# Patient Record
Sex: Male | Born: 1990 | Race: Black or African American | Hispanic: No | Marital: Married | Smoking: Never smoker
Health system: Southern US, Community
[De-identification: ages and names within clinical notes are randomized; demographics above are authoritative.]

## PROBLEM LIST (undated history)

## (undated) DIAGNOSIS — I10 Essential (primary) hypertension: Secondary | ICD-10-CM

## (undated) DIAGNOSIS — K589 Irritable bowel syndrome without diarrhea: Secondary | ICD-10-CM

## (undated) HISTORY — DX: Morbid (severe) obesity due to excess calories: E66.01

## (undated) HISTORY — PX: TONSILLECTOMY: SUR1361

## (undated) HISTORY — PX: KNEE SURGERY: SHX244

## (undated) HISTORY — DX: Irritable bowel syndrome, unspecified: K58.9

---

## 2011-12-09 ENCOUNTER — Encounter (HOSPITAL_COMMUNITY): Payer: Self-pay | Admitting: Emergency Medicine

## 2011-12-09 ENCOUNTER — Emergency Department (INDEPENDENT_AMBULATORY_CARE_PROVIDER_SITE_OTHER): Payer: Self-pay

## 2011-12-09 ENCOUNTER — Emergency Department (INDEPENDENT_AMBULATORY_CARE_PROVIDER_SITE_OTHER)
Admission: EM | Admit: 2011-12-09 | Discharge: 2011-12-09 | Disposition: A | Discharge status: Home / Self Care | Payer: Self-pay | Source: Home / Self Care | Attending: Emergency Medicine | Admitting: Emergency Medicine

## 2011-12-09 DIAGNOSIS — M25469 Effusion, unspecified knee: Secondary | ICD-10-CM

## 2011-12-09 DIAGNOSIS — IMO0002 Reserved for concepts with insufficient information to code with codable children: Secondary | ICD-10-CM

## 2011-12-09 DIAGNOSIS — S8390XA Sprain of unspecified site of unspecified knee, initial encounter: Secondary | ICD-10-CM

## 2011-12-09 HISTORY — DX: Essential (primary) hypertension: I10

## 2011-12-09 MED ORDER — TRAMADOL HCL 50 MG PO TABS: 100.0000 mg | ORAL_TABLET | Freq: Three times a day (TID) | ORAL | Status: AC | PRN

## 2011-12-09 MED ORDER — MELOXICAM 15 MG PO TABS: 15.0000 mg | ORAL_TABLET | Freq: Every day | ORAL | Status: AC
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
## 2012-12-25 ENCOUNTER — Emergency Department (HOSPITAL_COMMUNITY): Payer: BC Managed Care – PPO

## 2012-12-25 ENCOUNTER — Other Ambulatory Visit: Payer: Self-pay

## 2012-12-25 ENCOUNTER — Encounter (HOSPITAL_COMMUNITY): Payer: Self-pay | Admitting: Nurse Practitioner

## 2012-12-25 ENCOUNTER — Emergency Department (HOSPITAL_COMMUNITY)
Admission: EM | Admit: 2012-12-25 | Discharge: 2012-12-25 | Disposition: A | Discharge status: Home / Self Care | Payer: BC Managed Care – PPO | Attending: Emergency Medicine | Admitting: Emergency Medicine

## 2012-12-25 DIAGNOSIS — R0789 Other chest pain: Secondary | ICD-10-CM | POA: Insufficient documentation

## 2012-12-25 DIAGNOSIS — F411 Generalized anxiety disorder: Secondary | ICD-10-CM | POA: Insufficient documentation

## 2012-12-25 DIAGNOSIS — R079 Chest pain, unspecified: Secondary | ICD-10-CM

## 2012-12-25 DIAGNOSIS — I10 Essential (primary) hypertension: Secondary | ICD-10-CM | POA: Insufficient documentation

## 2012-12-25 LAB — PRO B NATRIURETIC PEPTIDE: Pro B Natriuretic peptide (BNP): <5 pg/mL (ref 0–125)

## 2012-12-25 LAB — BASIC METABOLIC PANEL WITH GFR
BUN: 9 mg/dL (ref 6–23)
CO2: 27 meq/L (ref 19–32)
Calcium: 9.8 mg/dL (ref 8.4–10.5)
Chloride: 100 meq/L (ref 96–112)
Creatinine, Ser: 1.01 mg/dL (ref 0.50–1.35)
GFR calc Af Amer: >90 mL/min
GFR calc non Af Amer: >90 mL/min
Glucose, Bld: 143 mg/dL — ABNORMAL HIGH (ref 70–99)
Potassium: 3.9 meq/L (ref 3.5–5.1)
Sodium: 134 meq/L — ABNORMAL LOW (ref 135–145)

## 2012-12-25 LAB — CBC
HCT: 40.2 % (ref 39.0–52.0)
Hemoglobin: 14.6 g/dL (ref 13.0–17.0)
MCH: 27.3 pg (ref 26.0–34.0)
MCHC: 36.3 g/dL — ABNORMAL HIGH (ref 30.0–36.0)
MCV: 75.1 fL — ABNORMAL LOW (ref 78.0–100.0)
Platelets: 176 K/uL (ref 150–400)
RBC: 5.35 MIL/uL (ref 4.22–5.81)
RDW: 14 % (ref 11.5–15.5)
WBC: 11.4 K/uL — ABNORMAL HIGH (ref 4.0–10.5)

## 2012-12-25 LAB — POCT I-STAT TROPONIN I: Troponin i, poc: 0 ng/mL (ref 0.00–0.08)

## 2012-12-25 NOTE — ED Provider Notes (Signed)
History     CSN: [id number]  Arrival date & time 12/25/12  1039   First MD Initiated Contact with Patient 12/25/12 1130      Chief Complaint  Patient presents with  . Chest Pain    (Consider location/radiation/quality/duration/timing/severity/associated sxs/prior treatment) HPI Comments: The patient has noticed symptoms for at least the last several months. He mentioned to the nursing staff that he has had for the past 2 years. The symptoms occur intermittently. He mostly notices it when he is doing light activity. He will also get anxious and nervous. He exercises regularly and states he never has any trouble when he does that. He can go work out at [employer] and will feel completely fine. He denies any leg swelling, no fevers. He denies any history of blood clots or heart disease. He has no significant family history of lung or heart disease.  Patient is a 22 y.o. male presenting with chest pain. The history is provided by the patient.  Chest Pain Pain location:  Substernal area Pain quality: sharp and tightness   Pain radiates to:  Does not radiate Pain severity:  Mild Onset quality:  Sudden Timing:  Sporadic Progression:  Worsening Relieved by:  Nothing Worsened by:  Deep breathing Ineffective treatments:  None tried Associated symptoms: anxiety   Associated symptoms: no fatigue, no fever, no orthopnea, no syncope and not vomiting     Past Medical History  Diagnosis Date  . Hypertension     Past Surgical History  Procedure Laterality Date  . Knee surgery      right    History reviewed. No pertinent family history.  History  Substance Use Topics  . Smoking status: Never Smoker   . Smokeless tobacco: Not on file  . Alcohol Use: No      Review of Systems  Constitutional: Negative for fever and fatigue.  Cardiovascular: Positive for chest pain. Negative for orthopnea and syncope.  Gastrointestinal: Negative for vomiting.  All other systems reviewed and are  negative.    Allergies  Review of patient's allergies indicates no known allergies.  Home Medications  No current outpatient prescriptions on file.  BP 168/101  Pulse 84  Temp(Src) 99 F (37.2 C) (Oral)  Resp 16  SpO2 99%  Physical Exam  Nursing note and vitals reviewed. Constitutional: He appears well-developed and well-nourished. No distress.  Muscular  HENT:  Head: Normocephalic and atraumatic.  Right Ear: External ear normal.  Left Ear: External ear normal.  Eyes: Conjunctivae are normal. Right eye exhibits no discharge. Left eye exhibits no discharge. No scleral icterus.  Neck: Neck supple. No tracheal deviation present.  Cardiovascular: Normal rate, regular rhythm and intact distal pulses.   Pulmonary/Chest: Effort normal and breath sounds normal. No stridor. No respiratory distress. He has no wheezes. He has no rales.  Abdominal: Soft. Bowel sounds are normal. He exhibits no distension. There is no tenderness. There is no rebound and no guarding.  Musculoskeletal: He exhibits no edema and no tenderness.  Neurological: He is alert. He has normal strength. No sensory deficit. Cranial nerve deficit:  no gross defecits noted. He exhibits normal muscle tone. He displays no seizure activity. Coordination normal.  Skin: Skin is warm and dry. No rash noted.  Psychiatric: He has a normal mood and affect.    ED Course  Procedures (including critical care time) EKG Normal sinus rhythm, rate 85 Normal intervals, normal axis Normal ST-T waves No prior EKG for comparison Labs Reviewed  CBC -  Abnormal; Notable for the following:    WBC 11.4 (*)    MCV 75.1 (*)    MCHC 36.3 (*)    All other components within normal limits  BASIC METABOLIC PANEL - Abnormal; Notable for the following:    Sodium 134 (*)    Glucose, Bld 143 (*)    All other components within normal limits  PRO B NATRIURETIC PEPTIDE  POCT I-STAT TROPONIN I   Dg Chest 2 View  12/25/2012  *RADIOLOGY REPORT*   Clinical Data: Chest pain and shortness of breath.  CHEST - 2 VIEW  Comparison: None.  Findings: Two views of the chest demonstrate clear lungs.  Heart and mediastinum are within normal limits and the trachea is midline.  Slightly low lung volumes on the frontal view.  IMPRESSION: No acute chest findings.   Original Report Authenticated By: Richarda Overlie, M.D.       MDM  The patient presented to the emergency room with complaints of recurrent chest pain. I doubt acute coronary syndrome, pulmonary embolism or other acute emergency medical condition. Symptoms could be related to anxiety. I recommend she followup with her primary Dr. to discuss further treatment and evaluation.       Celene Kras, MD 12/25/12 [phone]

## 2012-12-25 NOTE — ED Notes (Addendum)
Pt reports for past 2 years he has had some intermittent CP and noticed recently he has been SOB when walking up stairs. TOday he woke up and felt tightness in chest, increased on inspiration, radiating down left arm, and states it is making him feel anxious, and its worse than normal. A&Ox4, resp e/u

## 2013-05-17 ENCOUNTER — Emergency Department (INDEPENDENT_AMBULATORY_CARE_PROVIDER_SITE_OTHER)
Admission: EM | Admit: 2013-05-17 | Discharge: 2013-05-17 | Disposition: A | Discharge status: Home / Self Care | Payer: BC Managed Care – PPO | Source: Home / Self Care | Attending: Family Medicine | Admitting: Family Medicine

## 2013-05-17 ENCOUNTER — Encounter (HOSPITAL_COMMUNITY): Payer: Self-pay | Admitting: *Deleted

## 2013-05-17 DIAGNOSIS — G44209 Tension-type headache, unspecified, not intractable: Secondary | ICD-10-CM

## 2013-05-17 LAB — POCT I-STAT, CHEM 8
BUN: 13 mg/dL (ref 6–23)
Calcium, Ion: 1.25 mmol/L — ABNORMAL HIGH (ref 1.12–1.23)
Creatinine, Ser: 1 mg/dL (ref 0.50–1.35)
Glucose, Bld: 139 mg/dL — ABNORMAL HIGH (ref 70–99)
Hemoglobin: 15 g/dL (ref 13.0–17.0)
Sodium: 142 mEq/L (ref 135–145)
TCO2: 25 mmol/L (ref 0–100)

## 2013-05-17 MED ORDER — FUROSEMIDE 40 MG PO TABS
ORAL_TABLET | ORAL | Status: AC
  Filled 2013-05-17: qty 1

## 2013-05-17 MED ORDER — TRAZODONE HCL 50 MG PO TABS: 50.0000 mg | ORAL_TABLET | Freq: Every day | ORAL | Status: DC

## 2013-05-17 MED ORDER — FUROSEMIDE 40 MG PO TABS
40.0000 mg | ORAL_TABLET | Freq: Every day | ORAL | Status: DC
  Administered 2013-05-17: 40 mg via ORAL

## 2013-05-17 MED ORDER — FUROSEMIDE 10 MG/ML IJ SOLN
INTRAMUSCULAR | Status: AC
  Filled 2013-05-17: qty 4

## 2013-05-17 NOTE — ED Provider Notes (Signed)
   History    CSN: [id number] Arrival date & time 05/17/13  1001  First MD Initiated Contact with Patient 05/17/13 1018     Chief Complaint  Patient presents with  . Headache   (Consider location/radiation/quality/duration/timing/severity/associated sxs/prior Treatment) Patient is a 22 y.o. male presenting with headaches. The history is provided by the patient.  Headache Pain location:  Frontal Quality:  Dull Radiates to:  Does not radiate Onset quality:  Gradual Duration:  2 weeks Timing:  Intermittent Progression:  Unchanged Chronicity:  New Similar to prior headaches: no   Context: not exposure to bright light   Context comment:  Working 2 jobs, not sleeping or eating well, just get what I can.. Associated symptoms: no dizziness and no numbness    Past Medical History  Diagnosis Date  . Hypertension    Past Surgical History  Procedure Laterality Date  . Knee surgery      right   History reviewed. No pertinent family history. History  Substance Use Topics  . Smoking status: Never Smoker   . Smokeless tobacco: Not on file  . Alcohol Use: No    Review of Systems  Constitutional: Negative.   Cardiovascular: Negative.   Gastrointestinal: Negative.   Neurological: Positive for headaches. Negative for dizziness, weakness and numbness.    Allergies  Review of patient's allergies indicates no known allergies.  Home Medications   Current Outpatient Rx  Name  Route  Sig  Dispense  Refill  . traZODone (DESYREL) 50 MG tablet   Oral   Take 1 tablet (50 mg total) by mouth at bedtime.   15 tablet   1    BP 155/88  Pulse 73  Temp(Src) 98.5 F (36.9 C) (Oral)  Resp 15  SpO2 100% Physical Exam  Nursing note and vitals reviewed. Constitutional: He is oriented to person, place, and time. He appears well-developed and well-nourished. No distress.  HENT:  Head: Normocephalic.  Right Ear: External ear normal.  Left Ear: External ear normal.  Mouth/Throat:  Oropharynx is clear and moist.  Neck: Normal range of motion. Neck supple.  Cardiovascular: Regular rhythm.   Lymphadenopathy:    He has no cervical adenopathy.  Neurological: He is alert and oriented to person, place, and time. No cranial nerve deficit.  Skin: Skin is warm and dry.    ED Course  Procedures (including critical care time) Labs Reviewed  POCT I-STAT, CHEM 8 - Abnormal; Notable for the following:    Glucose, Bld 139 (*)    Calcium, Ion 1.25 (*)    All other components within normal limits   No results found. 1. Tension type headache     MDM    Linna Hoff, MD 05/17/13 1114

## 2013-05-17 NOTE — ED Notes (Signed)
Headache    Blurred  Vision      X  sev  Weeks          Numb  sensation  r  Foot  X   2  Months   Denys  Any  Recent injury  Sitting  On  Exam  Table  In no  Distress     Alert  And  Oriented

## 2014-02-10 ENCOUNTER — Emergency Department (HOSPITAL_COMMUNITY)
Admission: EM | Admit: 2014-02-10 | Discharge: 2014-02-10 | Disposition: A | Discharge status: Home / Self Care | Payer: BC Managed Care – PPO | Attending: Emergency Medicine | Admitting: Emergency Medicine

## 2014-02-10 ENCOUNTER — Encounter (HOSPITAL_COMMUNITY): Payer: Self-pay | Admitting: Emergency Medicine

## 2014-02-10 ENCOUNTER — Emergency Department (HOSPITAL_COMMUNITY): Payer: BC Managed Care – PPO

## 2014-02-10 DIAGNOSIS — R079 Chest pain, unspecified: Secondary | ICD-10-CM

## 2014-02-10 DIAGNOSIS — I10 Essential (primary) hypertension: Secondary | ICD-10-CM | POA: Insufficient documentation

## 2014-02-10 DIAGNOSIS — I517 Cardiomegaly: Secondary | ICD-10-CM | POA: Insufficient documentation

## 2014-02-10 DIAGNOSIS — IMO0001 Reserved for inherently not codable concepts without codable children: Secondary | ICD-10-CM

## 2014-02-10 DIAGNOSIS — R0602 Shortness of breath: Secondary | ICD-10-CM | POA: Insufficient documentation

## 2014-02-10 DIAGNOSIS — E669 Obesity, unspecified: Secondary | ICD-10-CM | POA: Insufficient documentation

## 2014-02-10 DIAGNOSIS — R030 Elevated blood-pressure reading, without diagnosis of hypertension: Secondary | ICD-10-CM

## 2014-02-10 DIAGNOSIS — R0789 Other chest pain: Secondary | ICD-10-CM | POA: Insufficient documentation

## 2014-02-10 LAB — I-STAT TROPONIN, ED: Troponin i, poc: 0.01 ng/mL (ref 0.00–0.08)

## 2014-02-10 LAB — BASIC METABOLIC PANEL
BUN: 9 mg/dL (ref 6–23)
CHLORIDE: 102 meq/L (ref 96–112)
CO2: 27 mEq/L (ref 19–32)
Calcium: 9.1 mg/dL (ref 8.4–10.5)
Creatinine, Ser: 1.15 mg/dL (ref 0.50–1.35)
GFR calc non Af Amer: 88 mL/min — ABNORMAL LOW (ref >90)
GLUCOSE: 92 mg/dL (ref 70–99)
POTASSIUM: 3.9 meq/L (ref 3.7–5.3)
SODIUM: 140 meq/L (ref 137–147)

## 2014-02-10 LAB — CBC
HEMATOCRIT: 38.6 % — AB (ref 39.0–52.0)
HEMOGLOBIN: 13.6 g/dL (ref 13.0–17.0)
MCH: 27.4 pg (ref 26.0–34.0)
MCHC: 35.2 g/dL (ref 30.0–36.0)
MCV: 77.8 fL — ABNORMAL LOW (ref 78.0–100.0)
Platelets: 189 10*3/uL (ref 150–400)
RBC: 4.96 MIL/uL (ref 4.22–5.81)
RDW: 13.2 % (ref 11.5–15.5)
WBC: 5.1 10*3/uL (ref 4.0–10.5)

## 2014-02-10 MED ORDER — NAPROXEN 500 MG PO TABS: 500.0000 mg | ORAL_TABLET | Freq: Two times a day (BID) | ORAL | Status: DC

## 2014-02-10 MED ORDER — TRAMADOL HCL 50 MG PO TABS
50.0000 mg | ORAL_TABLET | Freq: Four times a day (QID) | ORAL | Status: DC | PRN
Start: 2014-02-10 — End: 2014-05-17

## 2014-02-10 MED ORDER — GI COCKTAIL ~~LOC~~
30.0000 mL | Freq: Once | ORAL | Status: AC
  Administered 2014-02-10: 30 mL via ORAL
  Filled 2014-02-10: qty 30

## 2014-02-10 NOTE — ED Provider Notes (Signed)
CSN: [id number]     Arrival date & time 02/10/14  7322 History   First MD Initiated Contact with Patient 02/10/14 0919     Chief Complaint  Patient presents with  . Chest Pain     (Consider location/radiation/quality/duration/timing/severity/associated sxs/prior Treatment) HPI Comments: Patient with history of hypertension, obesity -- presents with complaint of left chest pain with radiation into his left shoulder and arm. Patient has had pain like this in the past, intermittently. Current episode started at 4 PM yesterday while at work. Pain is not worse with movement or palpation. Patient states that when he exerts himself his heart beats fast and he feel short of breath. This is not new. Patient denies risk factors for pulmonary embolism including: unilateral leg swelling, history of DVT/PE/other blood clots, recent immobilizations, recent surgery, recent travel (>4hr segment), malignancy, hemoptysis. No fevers or cough. Patient does have history of heartburn especially at night. Patient denies history of high cholesterol, diabetes or smoking. He does not have a family history of MI or heart disease in first-degree relatives. Patient does not currently have a primary care physician. He states that he was seen in emergency department last July for the same pain. The onset of this condition was acute. The course is constant. Aggravating factors: none. Alleviating factors: none.      Patient is a 23 y.o. male presenting with chest pain. The history is provided by the patient.  Chest Pain Associated symptoms: shortness of breath   Associated symptoms: no abdominal pain, no back pain, no cough, no diaphoresis, no fever, no nausea, no palpitations and not vomiting     Past Medical History  Diagnosis Date  . Hypertension    Past Surgical History  Procedure Laterality Date  . Knee surgery      right   No family history on file. History  Substance Use Topics  . Smoking status: Never Smoker    . Smokeless tobacco: Not on file  . Alcohol Use: No    Review of Systems  Constitutional: Negative for fever and diaphoresis.  Eyes: Negative for redness.  Respiratory: Positive for shortness of breath. Negative for cough.   Cardiovascular: Positive for chest pain. Negative for palpitations and leg swelling.  Gastrointestinal: Negative for nausea, vomiting and abdominal pain.  Genitourinary: Negative for dysuria.  Musculoskeletal: Positive for myalgias. Negative for back pain and neck pain.  Skin: Negative for rash.  Neurological: Negative for syncope and light-headedness.      Allergies  Review of patient's allergies indicates no known allergies.  Home Medications  No current outpatient prescriptions on file. BP 149/88  Pulse 75  Temp(Src) 98 F (36.7 C) (Oral)  Resp 22  Ht 6\' 1"  (1.854 m)  Wt 333 lb (151.048 kg)  BMI 43.94 kg/m2  SpO2 100%  Physical Exam  Nursing note and vitals reviewed. Constitutional: He appears well-developed and well-nourished.  HENT:  Head: Normocephalic and atraumatic.  Mouth/Throat: Mucous membranes are normal. Mucous membranes are not dry.  Eyes: Conjunctivae are normal.  Neck: Trachea normal and normal range of motion. Neck supple. Normal carotid pulses and no JVD present. No muscular tenderness present. Carotid bruit is not present. No tracheal deviation present.  Cardiovascular: Normal rate, regular rhythm, S1 normal, S2 normal, normal heart sounds and intact distal pulses.  Exam reveals no distant heart sounds and no decreased pulses.   No murmur heard. Pulses:      Radial pulses are 2+ on the right side, and 2+ on the left  side.  Pulmonary/Chest: Effort normal and breath sounds normal. No respiratory distress. He has no wheezes. He exhibits no tenderness.  Movement of arm and palpation of chest does not reproduce pain.   Abdominal: Soft. Normal aorta and bowel sounds are normal. There is no tenderness. There is no rebound and no  guarding.  Musculoskeletal: Normal range of motion. He exhibits no edema.  Normal active ROM upper extremities.   Neurological: He is alert.  Skin: Skin is warm and dry. He is not diaphoretic. No cyanosis. No pallor.  Psychiatric: He has a normal mood and affect.    ED Course  Procedures (including critical care time) Labs Review Labs Reviewed  CBC - Abnormal; Notable for the following:    HCT 38.6 (*)    MCV 77.8 (*)    All other components within normal limits  BASIC METABOLIC PANEL - Abnormal; Notable for the following:    GFR calc non Af Amer 88 (*)    All other components within normal limits  I-STAT TROPOININ, ED   Imaging Review Dg Chest 2 View  02/10/2014   CLINICAL DATA:  Two day history of left-sided chest pain radiating into the left arm. Current history of hypertension.  EXAM: CHEST  2 VIEW  COMPARISON:  DG CHEST 2 VIEW dated 12/25/2012  FINDINGS: Cardiac silhouette mildly to moderately enlarged for age, unchanged. Hilar and mediastinal contours otherwise unremarkable. Lungs clear. Bronchovascular markings normal. Pulmonary vascularity normal. No visible pleural effusions. No pneumothorax. Visualized bony thorax intact.  IMPRESSION: Mild to moderate cardiomegaly.  No acute cardiopulmonary disease.   Electronically Signed   By: Evangeline Dakin M.D.   On: 02/10/2014 10:01     EKG Interpretation   Date/Time:  Friday February 10 2014 08:56:25 EDT Ventricular Rate:  74 PR Interval:  194 QRS Duration: 102 QT Interval:  364 QTC Calculation: 404 R Axis:   44 Text Interpretation:  Normal sinus rhythm Normal ECG No significant change  since last tracing Confirmed by Maryan Rued  MD, Loree Fee ([id number]) on 02/10/2014  9:14:37 AM      9:31 AM Patient seen and examined. Work-up initiated. Medications ordered. EKG reviewed.   Vital signs reviewed and are as follows: Filed Vitals:   02/10/14 0856  BP: 149/88  Pulse: 75  Temp: 98 F (36.7 C)  Resp: 22   11:16 AM Patient notified  of results. Symptoms unchanged with GI cocktail.  Patient informed of mild cardiomegaly on chest x-ray. Strongly encouraged PCP followup. Referrals given.  Patient was counseled to return with severe chest pain, especially if the pain is crushing or pressure-like and spreads to the arms, back, neck, or jaw, or if they have sweating, nausea, or shortness of breath with the pain. They were encouraged to call 911 with these symptoms.   They were also told to return if their chest pain gets worse and does not go away with rest, they have an attack of chest pain lasting longer than usual despite rest and treatment with the medications their caregiver has prescribed, if they wake from sleep with chest pain or shortness of breath, if they feel dizzy or faint, if they have chest pain not typical of their usual pain, or if they have any other emergent concerns regarding their health.  The patient verbalized understanding and agreed.     MDM   Final diagnoses:  Chest pain  Cardiomegaly  Elevated blood pressure   Patient with chest tightness. Feel patient is low risk for ACS given history (  poor story for ACS/MI -- intermittent symptoms for a long period of time, constant chest pain for the past 18 hrs), negative troponin(s), normal/unchanged EKG. Also young age. Risk factors: obesity, HTN. No history of high cholesterol, diabetes, family history, non-smoker, no cocaine.  Do not suspect PE in this patient. He is PERC negative.  Cardiomegaly, to be followed by PCP. Consider blood pressure control.  Mildly elevated blood pressure today. Will defer initiation of oral antihypertensives to PCP.  No dangerous or life-threatening conditions suspected or identified by history, physical exam, and by work-up. No indications for hospitalization identified.       Carlisle Cater, PA-C 02/10/14 1121

## 2014-02-10 NOTE — ED Notes (Addendum)
Pt c/o left sided chest pain ongoing for months. Pain increased yesterday while at work that radiated to left arm and pt reports feeling palpitations. Pain accompanied by shortness of breath, nausea, and diaphoresis.

## 2014-02-10 NOTE — ED Notes (Signed)
Cp since July of last year was told then he was exhausted from work works 2 jobs some sob no n/v  Stinging pain and then pain in left shoulder comes and goes and since yesterday constant

## 2014-02-10 NOTE — ED Provider Notes (Signed)
Medical screening examination/treatment/procedure(s) were performed by non-physician practitioner and as supervising physician I was immediately available for consultation/collaboration.   EKG Interpretation   Date/Time:  Friday February 10 2014 08:56:25 EDT Ventricular Rate:  74 PR Interval:  194 QRS Duration: 102 QT Interval:  364 QTC Calculation: 404 R Axis:   44 Text Interpretation:  Normal sinus rhythm Normal ECG No significant change  since last tracing Confirmed by Maryan Rued  MD, Loree Fee ([id number]) on 02/10/2014  9:14:37 AM        Blanchie Dessert, MD 02/10/14 1319

## 2014-02-10 NOTE — Discharge Instructions (Signed)
Please read and follow all provided instructions.  Your diagnoses today include:  1. Chest pain   2. Cardiomegaly   3. Elevated blood pressure     Tests performed today include:  An EKG of your heart - normal  A chest x-ray - shows slightly enlarged heart  Cardiac enzymes - a blood test for heart muscle damage that is normal  Blood counts and electrolytes - normal  Vital signs. See below for your results today.   Medications prescribed:   Naproxen - anti-inflammatory pain medication  Do not exceed 500mg  naproxen every 12 hours, take with food  You have been prescribed an anti-inflammatory medication or NSAID. Take with food. Take smallest effective dose for the shortest duration needed for your pain. Stop taking if you experience stomach pain or vomiting.    Tramadol - narcotic-like pain medication  DO NOT drive or perform any activities that require you to be awake and alert because this medicine can make you drowsy.   Take any prescribed medications only as directed.  Follow-up instructions: Please follow-up with your primary care provider as soon as you can for further evaluation of your symptoms. If you do not have a primary care doctor -- see below for referral information.   Return instructions:  SEEK IMMEDIATE MEDICAL ATTENTION IF:  You have severe chest pain, especially if the pain is crushing or pressure-like and spreads to the arms, back, neck, or jaw, or if you have sweating, nausea (feeling sick to your stomach), or shortness of breath. THIS IS AN EMERGENCY. Don't wait to see if the pain will go away. Get medical help at once. Call 911 or 0 (operator). DO NOT drive yourself to the hospital.   Your chest pain gets worse and does not go away with rest.   You have an attack of chest pain lasting longer than usual, despite rest and treatment with the medications your caregiver has prescribed.   You wake from sleep with chest pain or shortness of breath.  You  feel dizzy or faint.  You have chest pain not typical of your usual pain for which you originally saw your caregiver.   You have any other emergent concerns regarding your health.  Additional Information: Chest pain comes from many different causes. Your caregiver has diagnosed you as having chest pain that is not specific for one problem, but does not require admission.  You are at low risk for an acute heart condition or other serious illness.   Your vital signs today were: BP 149/88   Pulse 75   Temp(Src) 98 F (36.7 C) (Oral)   Resp 19   Ht 6\' 1"  (1.854 m)   Wt 333 lb (151.048 kg)   BMI 43.94 kg/m2   SpO2 100% If your blood pressure (BP) was elevated above 135/85 this visit, please have this repeated by your doctor within one month. --------------  Emergency Department Resource Guide 1) Find a Doctor and Pay Out of Pocket Although you won't have to find out who is covered by your insurance plan, it is a good idea to ask around and get recommendations. You will then need to call the office and see if the doctor you have chosen will accept you as a new patient and what types of options they offer for patients who are self-pay. Some doctors offer discounts or will set up payment plans for their patients who do not have insurance, but you will need to ask so you aren't surprised when you  get to your appointment.  2) Contact Your Local Health Department Not all health departments have doctors that can see patients for sick visits, but many do, so it is worth a call to see if yours does. If you don't know where your local health department is, you can check in your phone book. The CDC also has a tool to help you locate your state's health department, and many state websites also have listings of all of their local health departments.  3) Find a [facility] If your illness is not likely to be very severe or complicated, you may want to try a walk in clinic. These are popping up all over the  country in pharmacies, drugstores, and shopping centers. They're usually staffed by nurse practitioners or physician assistants that have been trained to treat common illnesses and complaints. They're usually fairly quick and inexpensive. However, if you have serious medical issues or chronic medical problems, these are probably not your best option.  No Primary Care Doctor: - Call Health Connect at  [phone] - they can help you locate a primary care doctor that  accepts your insurance, provides certain services, etc. - Physician Referral Service- [phone]  Chronic Pain Problems: Organization         Address  Phone   Notes  [facility]  [phone] Patients need to be referred by their primary care doctor.   Medication Assistance: Organization         Address  Phone   Notes  [facility] Medication [facility] [address] [place], [place] [address] [phone] --Must be a resident of [facility] -- Must have NO insurance coverage whatsoever (no Medicaid/ Medicare, etc.) -- The pt. MUST have a primary care doctor that directs their care regularly and follows them in the community   MedAssist  [phone]   [employer]  [phone]    Agencies that provide inexpensive medical care: Organization         Address  Phone   Notes  [place]  [phone]   [facility]    [phone]   [facility] [place], [place] [address] [phone]   [place] [address] [place] [phone]   Planned Parenthood    [phone]   [facility]    [phone]   [place] and [place] [address], [place] Phone:  [phone], Fax:  [phone] Hours of Operation:  9 am - 6 pm, M-F.  Also accepts Medicaid/Medicare and self-pay.  [facility] for [place], [address], [place] Phone: [phone], Fax: [phone]. Hours of Operation:  8:30 am - 5:30 pm, M-F.  Also accepts Medicaid and self-pay.  [facility] [place] [facility] [address], [place] Phone: [phone]   [place], [place], [place] [phone], Ext. [phone] Mondays & Thursdays: 7-9 AM.  First 15 patients are seen on a first come, first serve basis.    [place] Providers:  Organization         Address  Phone   Notes  [facility] [address], [place] [phone] Also accepts self-pay patients.  [place], [place]  [phone]   [facility]  [address], [place] [phone]   [place] [address] [place] [phone]   Lucianne Lei [address], [place]   [phone] Only accepts [place] Access [place] patients after they have their name applied to their card.   Self-Pay (no insurance) in [facility]:  Organization         Address  Phone   Notes  Sickle Cell Patients, [facility] [place] [phone]   [facility] [place] [phone]   [facility] [place]  [place], [place], [place] [phone]   [facility]/Dr. Osei-Bonsu  [address], [place] or [address], [place] [phone] Phone number for both [place] and [place] locations is the same.  [facility] [address], [place] [phone]   [facility] [address], [place] or [address] [phone]   [facility] [address] [place] [phone], phone; [phone], fax Sees patients 1st and 3rd Saturday of every month.  Must  not qualify for public or private insurance (i.e. Medicaid, Medicare, [place] Health Choice, Veterans' Benefits)  Household income should be no more than 200% of the poverty level The clinic cannot treat you if you are pregnant or think you are pregnant  Sexually transmitted diseases are not treated at the clinic.    Dental Care: Organization         Address  Phone  Notes  [facility] Department of [facility] [phone] Accepts children up to age 64 who are enrolled in [place] or [place]; pregnant women with a Medicaid card; and children who have applied for Medicaid or [place] Health Choice, but were declined, whose parents can pay a reduced fee at time of service.  [facility] Department of [facility]  [address], [place] [phone] Accepts children up to age 45 who are enrolled in [place] or [place]; pregnant women with a Medicaid card; and children who have applied for Medicaid or [place] Health Choice, but were declined, whose parents can pay a reduced fee at time of service.  [place] Adult Dental Access PROGRAM  [place] [phone] Patients are seen by appointment only. Walk-ins are not accepted. [place] will see patients 106 years of age and older. Monday - Tuesday (8am-5pm) Most Wednesdays (8:30-5pm) $30 per visit, cash only  [facility] Adult Dental Access PROGRAM  [address], [facility] [phone] Patients are seen by appointment only. Walk-ins are not accepted. [place] will see patients 66 years of age and older. One Wednesday Evening (Monthly: Volunteer Based).  $30 per visit, cash only  [place]  [phone] for adults; Children under age 6, call Graduate Pediatric Dentistry at [phone]. Children aged 79-14, please call [phone] to request a pediatric application.  Dental services are  provided in all areas of dental care including fillings, crowns and bridges, complete and partial dentures, implants, gum treatment, root canals, and extractions. Preventive care is also provided. Treatment is provided to both adults and children. Patients are selected via a lottery and there is often a waiting list.   [facility] [address] [place]  [phone] [email or web address]   [facility] [address] [place], [place] [phone], Ext. [phone] Second and Fourth Thursday of each month, opens at 6:30 AM; Clinic ends at 9 AM.  Patients are seen on a first-come first-served basis, and a limited number are seen during each clinic.   [facility]  [address] Hillard Danker [place], [place] [phone]   Eligibility Requirements You must have lived in [place], [place], or [place] counties for at least the last three months.   You cannot be eligible for state or federal sponsored [employer], including [employer], [place], or [employer].   You generally cannot be eligible for healthcare insurance through your employer.    How to apply: Eligibility screenings are held every Tuesday and Wednesday afternoon from 1:00 pm until 4:00 pm. You do not need an appointment for the interview!  [facility] [address] [place], [place]   Eugenio Saenz  [place] Department  [place]  [phone]    Behavioral Health Resources in the Community: Intensive Outpatient Programs Organization         Address  Phone  Notes  [place]. [address], [place], [place] [phone]   [facility] [address], [place], [place]   ADS: Alcohol & Drug Svcs [address], [place], [place]   [place] [address]  [place], [place] or [phone]   Substance Abuse Resources Organization         Address  Phone  Notes  Alcohol and Drug Services  [phone]   [place]  [phone]   [facility] [place]   Chinita Pester  [phone]   Residential & Outpatient Substance Abuse Program  [phone]   Psychological Services Organization         Address  Phone  Notes  [facility]  [place]  [phone]   [place] [address], [place] or [phone]    Mobile Crisis Teams Organization         Address  Phone  Notes  [facility], Mobile Crisis Care Unit  [phone]   [facility]  [address] [place], [place]   [place] Levels [address] [place] [phone]    Self-Help/Support Groups Organization         Address  Phone             Notes  [place]. of [place] - variety of support groups  [place] Call for more information  Narcotics Anonymous (NA), [facility] [address], [employer] [place]  2 meetings at this location   Special educational needs teacher         Address  Phone  Notes  [facility]idential Treatment [place],    [place]  [phone]   [facility]  [address], [place] [id number], [place], [place]   [place], [place] [phone] Admissions: 8am-3pm M-F  Incentives Substance [place] [address],    [place], [place] [phone]   [facility] [address] Jadene Pierini [place], [place]   The [place].,  [place],  [place] - Intensive Outpatient [address], [place], [place]   [facility] ([place].,  [place], [place] [phone] or  [phone]   [facility] Services (RTS) [address], [place], [place] Accepts Medicaid  [facility] [address],  [place] [phone] Substance Abuse/Addiction Treatment   [facility] Organization         Address  Phone  Notes  [facility]  [phone]   Domenic Schwab, PhD [address] Arlis Porta [place], [place]   [phone] or [phone]   [place], [place] [phone]   [place] [address], [place], [place] [phone] Insurance/Medicaid/sponsorship through [facility] and Families [address]                                    [place], [place] [phone] [place] [address], [place] [phone]    Dr. Adele Schilder  [phone]   [facility] [place] Dept. 1) [address], [place] 2) [place] 3)  [address], [place] [phone]  [phone]   [place] [phone] or [phone] (After Hours)

## 2014-05-17 ENCOUNTER — Encounter (HOSPITAL_COMMUNITY): Payer: Self-pay | Admitting: Emergency Medicine

## 2014-05-17 ENCOUNTER — Emergency Department (HOSPITAL_COMMUNITY)
Admission: EM | Admit: 2014-05-17 | Discharge: 2014-05-18 | Disposition: A | Discharge status: Home / Self Care | Payer: BC Managed Care – PPO | Attending: Emergency Medicine | Admitting: Emergency Medicine

## 2014-05-17 DIAGNOSIS — Z9889 Other specified postprocedural states: Secondary | ICD-10-CM | POA: Diagnosis not present

## 2014-05-17 DIAGNOSIS — J029 Acute pharyngitis, unspecified: Secondary | ICD-10-CM | POA: Insufficient documentation

## 2014-05-17 DIAGNOSIS — Z792 Long term (current) use of antibiotics: Secondary | ICD-10-CM | POA: Insufficient documentation

## 2014-05-17 DIAGNOSIS — I10 Essential (primary) hypertension: Secondary | ICD-10-CM | POA: Insufficient documentation

## 2014-05-17 DIAGNOSIS — K1379 Other lesions of oral mucosa: Secondary | ICD-10-CM

## 2014-05-17 DIAGNOSIS — IMO0002 Reserved for concepts with insufficient information to code with codable children: Secondary | ICD-10-CM | POA: Diagnosis not present

## 2014-05-17 DIAGNOSIS — R220 Localized swelling, mass and lump, head: Secondary | ICD-10-CM | POA: Diagnosis present

## 2014-05-17 DIAGNOSIS — K1370 Unspecified lesions of oral mucosa: Secondary | ICD-10-CM | POA: Insufficient documentation

## 2014-05-17 DIAGNOSIS — R221 Localized swelling, mass and lump, neck: Secondary | ICD-10-CM | POA: Diagnosis present

## 2014-05-17 LAB — CBC WITH DIFFERENTIAL/PLATELET
Basophils Absolute: 0 10*3/uL (ref 0.0–0.1)
Basophils Relative: 0 % (ref 0–1)
Eosinophils Absolute: 0 10*3/uL (ref 0.0–0.7)
Eosinophils Relative: 0 % (ref 0–5)
HEMATOCRIT: 41.2 % (ref 39.0–52.0)
HEMOGLOBIN: 14.7 g/dL (ref 13.0–17.0)
Lymphocytes Relative: 12 % (ref 12–46)
Lymphs Abs: 1.5 10*3/uL (ref 0.7–4.0)
MCH: 26.9 pg (ref 26.0–34.0)
MCHC: 35.7 g/dL (ref 30.0–36.0)
MCV: 75.3 fL — ABNORMAL LOW (ref 78.0–100.0)
MONO ABS: 0.3 10*3/uL (ref 0.1–1.0)
MONOS PCT: 3 % (ref 3–12)
NEUTROS ABS: 10.5 10*3/uL — AB (ref 1.7–7.7)
Neutrophils Relative %: 85 % — ABNORMAL HIGH (ref 43–77)
Platelets: 230 10*3/uL (ref 150–400)
RBC: 5.47 MIL/uL (ref 4.22–5.81)
RDW: 14.5 % (ref 11.5–15.5)
WBC: 12.3 10*3/uL — ABNORMAL HIGH (ref 4.0–10.5)

## 2014-05-17 LAB — BASIC METABOLIC PANEL
Anion gap: 19 — ABNORMAL HIGH (ref 5–15)
BUN: 13 mg/dL (ref 6–23)
CHLORIDE: 93 meq/L — AB (ref 96–112)
CO2: 23 mEq/L (ref 19–32)
Calcium: 9.8 mg/dL (ref 8.4–10.5)
Creatinine, Ser: 1.02 mg/dL (ref 0.50–1.35)
GFR calc non Af Amer: >90 mL/min (ref >90)
GLUCOSE: 177 mg/dL — AB (ref 70–99)
Potassium: 4.5 mEq/L (ref 3.7–5.3)
Sodium: 135 mEq/L — ABNORMAL LOW (ref 137–147)

## 2014-05-17 MED ORDER — RACEPINEPHRINE HCL 2.25 % IN NEBU
0.5000 mL | INHALATION_SOLUTION | Freq: Once | RESPIRATORY_TRACT | Status: AC
  Administered 2014-05-17: 0.5 mL via RESPIRATORY_TRACT
  Filled 2014-05-17: qty 0.5

## 2014-05-17 MED ORDER — SODIUM CHLORIDE 0.9 % IV SOLN
1000.0000 mL | INTRAVENOUS | Status: DC
  Administered 2014-05-17: 1000 mL via INTRAVENOUS

## 2014-05-17 MED ORDER — METHYLPREDNISOLONE SODIUM SUCC 125 MG IJ SOLR
125.0000 mg | Freq: Once | INTRAMUSCULAR | Status: AC
  Administered 2014-05-17: 125 mg via INTRAVENOUS
  Filled 2014-05-17: qty 2

## 2014-05-17 MED ORDER — FAMOTIDINE IN NACL 20-0.9 MG/50ML-% IV SOLN
20.0000 mg | Freq: Once | INTRAVENOUS | Status: AC
  Administered 2014-05-17: 20 mg via INTRAVENOUS
  Filled 2014-05-17: qty 50

## 2014-05-17 MED ORDER — SODIUM CHLORIDE 0.9 % IV SOLN
1000.0000 mL | Freq: Once | INTRAVENOUS | Status: AC
  Administered 2014-05-17: 1000 mL via INTRAVENOUS

## 2014-05-17 MED ORDER — DIPHENHYDRAMINE HCL 50 MG/ML IJ SOLN
12.5000 mg | Freq: Once | INTRAMUSCULAR | Status: AC
  Administered 2014-05-17: 12.5 mg via INTRAVENOUS
  Filled 2014-05-17: qty 1

## 2014-05-17 MED ORDER — HYDROMORPHONE HCL PF 1 MG/ML IJ SOLN
1.0000 mg | Freq: Once | INTRAMUSCULAR | Status: AC
  Administered 2014-05-17: 1 mg via INTRAVENOUS
  Filled 2014-05-17: qty 1

## 2014-05-17 NOTE — ED Notes (Signed)
Family at bedside. 

## 2014-05-17 NOTE — ED Notes (Signed)
MD at bedside. 

## 2014-05-17 NOTE — ED Notes (Signed)
Patient states he feels like his throat is getting worse. Oxygen saturation levels are >98% on RA. Asked the EDP to come in and re-evaluate patients status. Orders received. Still waiting on ENT.

## 2014-05-17 NOTE — ED Notes (Addendum)
Pt in after having tonsillectomy this morning, this evening started to not be able to talk due to feeling short of breath, states he feels like his throat is swelling and is unable to maintain saliva, pt alert and diaphoretic, swelling to back of throat is noted

## 2014-05-17 NOTE — ED Provider Notes (Signed)
CSN: [id number]     Arrival date & time 05/17/14  1958 History   First MD Initiated Contact with Patient 05/17/14 2008     Chief Complaint  Patient presents with  . Oral Swelling     (Consider location/radiation/quality/duration/timing/severity/associated sxs/prior Treatment) Patient is a 23 y.o. male presenting with mouth sores.  Mouth Lesions Location:  Posterior pharynx Quality:  White (healing tonsillectomy beds) Onset quality:  Sudden Progression:  Worsening Chronicity:  New Context comment:  Tonsillectomy today Relieved by:  Nothing Associated symptoms: sore throat   Associated symptoms: no congestion, no dental pain, no fever, no malaise, no neck pain and no rash     Past Medical History  Diagnosis Date  . Hypertension    Past Surgical History  Procedure Laterality Date  . Knee surgery      right   History reviewed. No pertinent family history. History  Substance Use Topics  . Smoking status: Never Smoker   . Smokeless tobacco: Not on file  . Alcohol Use: No    Review of Systems  Constitutional: Negative for fever and chills.  HENT: Positive for mouth sores, sore throat and trouble swallowing. Negative for congestion.   Eyes: Negative for pain.  Respiratory: Negative for cough and shortness of breath.   Cardiovascular: Negative for chest pain.  Gastrointestinal: Negative for nausea, vomiting and abdominal pain.  Genitourinary: Negative for dysuria and flank pain.  Musculoskeletal: Negative for back pain and neck pain.  Skin: Negative for rash.  Neurological: Negative for seizures and headaches.      Allergies  Review of patient's allergies indicates no known allergies.  Home Medications   Prior to Admission medications   Medication Sig Start Date End Date Taking? Authorizing Provider  amoxicillin (AMOXIL) 400 MG/5ML suspension Take 400 mg by mouth 2 (two) times daily. For 10 days 05/16/14  Yes Historical Provider, MD  HYDROcodone-acetaminophen  (HYCET) 7.5-325 mg/15 ml solution Take 15 mLs by mouth every 4 (four) hours as needed. For pain 05/17/14  Yes Historical Provider, MD  predniSONE (DELTASONE) 10 MG tablet Take 5 tablets (50 mg total) by mouth daily. 05/18/14   Freddi Che, MD   BP 182/100  Pulse 86  Resp 18  SpO2 97% Physical Exam  Constitutional: He is oriented to person, place, and time. He appears well-developed and well-nourished. No distress.  HENT:  Head: Normocephalic and atraumatic.  Mouth/Throat: Posterior oropharyngeal edema (post tonsillectomy appearance with no significant bleeding at surgical sites. Uvula is significantly swollen and edematous. No bleeding appreciated.) present.  Eyes: Pupils are equal, round, and reactive to light.  Neck: Normal range of motion.  Cardiovascular: Normal rate and regular rhythm.   Pulmonary/Chest: Effort normal and breath sounds normal. No stridor.  Abdominal: Soft. He exhibits no distension. There is no tenderness.  Musculoskeletal: Normal range of motion.  Neurological: He is alert and oriented to person, place, and time.  Skin: Skin is warm. He is not diaphoretic.    ED Course  Procedures (including critical care time) Labs Review Labs Reviewed  CBC WITH DIFFERENTIAL - Abnormal; Notable for the following:    WBC 12.3 (*)    MCV 75.3 (*)    Neutrophils Relative % 85 (*)    Neutro Abs 10.5 (*)    All other components within normal limits  BASIC METABOLIC PANEL - Abnormal; Notable for the following:    Sodium 135 (*)    Chloride 93 (*)    Glucose, Bld 177 (*)    Anion  gap 19 (*)    All other components within normal limits    Imaging Review No results found.   EKG Interpretation None      MDM   Final diagnoses:  Uvular swelling   23 year old male with a history of a tonsillectomy performed this morning presents this evening after feeling short of breath and unable to swallow.  Upon arrival here the patient looks distressed. According to the wife the  bedside, the patient has been having difficulty breathing and swallowing. My exam is documented as above and demonstrates a significantly swollen and edematous uvula and posterior pharynx otherwise normal appearing postsurgical sites of the tonsillar beds. Patient took amoxicillin earlier today. Patient's timing does not seen consistent with an allergic reaction to the medication. Regardless, the patient will be treated with steroids, racemic epinephrine, Prevacid, and Benadryl.  Patient reassessed and showed mild improvement, the patient states he is not feeling much better. The patient appears far more comfortable and is no longer appearing in distress. The wife at the bedside also feels that the patient appears much more comfortable. The ENT service who performed a tonsillectomy was contacted. He feels the patient is looking better this time is not necessitated inpatient admission. On reassessment, the uvula appears to have decreased in swelling. The patient appears more comfortable. Patient will followup with his ENT physician. Patient will be discharged with a course of steroids. Strict return precautions were given. Patient and the wife for a couple discharge at this time. Patient was discharged in stable condition. Patient was seen and evaluated by myself and by the attending Dr. Jeneen Rinks.  Freddi Che, MD 05/18/14 [phone]

## 2014-05-18 MED ORDER — PREDNISONE 10 MG PO TABS: 50.0000 mg | ORAL_TABLET | Freq: Every day | ORAL | Status: DC

## 2014-05-18 NOTE — ED Notes (Signed)
Patient discharged with all personal belongings. 

## 2014-05-27 NOTE — ED Provider Notes (Signed)
Patient seen and evaluated independently.D/W Dr. Silvio Clayman.  I agree with assessment and plan.  Pt with maked uvula swelling s/p Tonsillectomy this am.  Pt not drooling or tripoding.  Given multiple meds including racemeic epi, steroids, benadryl . Observed several hours.   Clinically, and subjectively shows improvement.  Able to eat.  Able to lay supine without  dyspnea or apprehension.  Sleeping on recheck.  Pt stable for DC.  Will f/u with  Surgeon as scheduled.  RTER with any worsening symptoms.  Tanna Furry, MD 05/27/14 [phone]

## 2014-11-12 ENCOUNTER — Emergency Department (HOSPITAL_COMMUNITY)
Admission: EM | Admit: 2014-11-12 | Discharge: 2014-11-12 | Disposition: A | Discharge status: Home / Self Care | Payer: Self-pay | Attending: Emergency Medicine | Admitting: Emergency Medicine

## 2014-11-12 ENCOUNTER — Encounter (HOSPITAL_COMMUNITY): Payer: Self-pay | Admitting: Emergency Medicine

## 2014-11-12 ENCOUNTER — Emergency Department (HOSPITAL_COMMUNITY): Payer: Self-pay

## 2014-11-12 DIAGNOSIS — H538 Other visual disturbances: Secondary | ICD-10-CM

## 2014-11-12 DIAGNOSIS — I10 Essential (primary) hypertension: Secondary | ICD-10-CM | POA: Insufficient documentation

## 2014-11-12 DIAGNOSIS — H539 Unspecified visual disturbance: Secondary | ICD-10-CM | POA: Insufficient documentation

## 2014-11-12 DIAGNOSIS — R110 Nausea: Secondary | ICD-10-CM | POA: Insufficient documentation

## 2014-11-12 DIAGNOSIS — R739 Hyperglycemia, unspecified: Secondary | ICD-10-CM | POA: Insufficient documentation

## 2014-11-12 DIAGNOSIS — R519 Headache, unspecified: Secondary | ICD-10-CM

## 2014-11-12 DIAGNOSIS — Z7952 Long term (current) use of systemic steroids: Secondary | ICD-10-CM | POA: Insufficient documentation

## 2014-11-12 DIAGNOSIS — R51 Headache: Secondary | ICD-10-CM

## 2014-11-12 LAB — CBC WITH DIFFERENTIAL/PLATELET
Basophils Absolute: 0 10*3/uL (ref 0.0–0.1)
Basophils Relative: 1 % (ref 0–1)
Eosinophils Absolute: 0.1 10*3/uL (ref 0.0–0.7)
Eosinophils Relative: 1 % (ref 0–5)
HCT: 39.5 % (ref 39.0–52.0)
HEMOGLOBIN: 13.8 g/dL (ref 13.0–17.0)
LYMPHS PCT: 47 % — AB (ref 12–46)
Lymphs Abs: 2.8 10*3/uL (ref 0.7–4.0)
MCH: 26.3 pg (ref 26.0–34.0)
MCHC: 34.9 g/dL (ref 30.0–36.0)
MCV: 75.2 fL — AB (ref 78.0–100.0)
MONO ABS: 0.2 10*3/uL (ref 0.1–1.0)
MONOS PCT: 4 % (ref 3–12)
NEUTROS PCT: 47 % (ref 43–77)
Neutro Abs: 2.7 10*3/uL (ref 1.7–7.7)
PLATELETS: 181 10*3/uL (ref 150–400)
RBC: 5.25 MIL/uL (ref 4.22–5.81)
RDW: 13.6 % (ref 11.5–15.5)
WBC: 5.8 10*3/uL (ref 4.0–10.5)

## 2014-11-12 LAB — COMPREHENSIVE METABOLIC PANEL
ALBUMIN: 4.1 g/dL (ref 3.5–5.2)
ALT: 32 U/L (ref 0–53)
AST: 23 U/L (ref 0–37)
Alkaline Phosphatase: 60 U/L (ref 39–117)
Anion gap: 6 (ref 5–15)
BUN: 5 mg/dL — ABNORMAL LOW (ref 6–23)
CALCIUM: 9.3 mg/dL (ref 8.4–10.5)
CO2: 25 mmol/L (ref 19–32)
Chloride: 105 mEq/L (ref 96–112)
Creatinine, Ser: 1.03 mg/dL (ref 0.50–1.35)
GFR calc Af Amer: >90 mL/min (ref >90)
Glucose, Bld: 174 mg/dL — ABNORMAL HIGH (ref 70–99)
Potassium: 3.8 mmol/L (ref 3.5–5.1)
Sodium: 136 mmol/L (ref 135–145)
Total Bilirubin: 0.7 mg/dL (ref 0.3–1.2)
Total Protein: 6.8 g/dL (ref 6.0–8.3)

## 2014-11-12 LAB — URINALYSIS, ROUTINE W REFLEX MICROSCOPIC
Bilirubin Urine: NEGATIVE
Glucose, UA: NEGATIVE mg/dL
Hgb urine dipstick: NEGATIVE
Ketones, ur: NEGATIVE mg/dL
Leukocytes, UA: NEGATIVE
Nitrite: NEGATIVE
PROTEIN: NEGATIVE mg/dL
Specific Gravity, Urine: 1.016 (ref 1.005–1.030)
UROBILINOGEN UA: 0.2 mg/dL (ref 0.0–1.0)
pH: 7.5 (ref 5.0–8.0)

## 2014-11-12 MED ORDER — DIPHENHYDRAMINE HCL 50 MG/ML IJ SOLN
25.0000 mg | Freq: Once | INTRAMUSCULAR | Status: AC
  Administered 2014-11-12: 25 mg via INTRAVENOUS
  Filled 2014-11-12: qty 1

## 2014-11-12 MED ORDER — MAGNESIUM SULFATE IN D5W 10-5 MG/ML-% IV SOLN
1.0000 g | Freq: Once | INTRAVENOUS | Status: DC
  Filled 2014-11-12: qty 100

## 2014-11-12 MED ORDER — KETOROLAC TROMETHAMINE 30 MG/ML IJ SOLN
30.0000 mg | Freq: Once | INTRAMUSCULAR | Status: AC
  Administered 2014-11-12: 30 mg via INTRAVENOUS
  Filled 2014-11-12: qty 1

## 2014-11-12 MED ORDER — PROCHLORPERAZINE EDISYLATE 5 MG/ML IJ SOLN
10.0000 mg | Freq: Once | INTRAMUSCULAR | Status: AC
  Administered 2014-11-12: 10 mg via INTRAVENOUS
  Filled 2014-11-12: qty 2

## 2014-11-12 MED ORDER — SODIUM CHLORIDE 0.9 % IV BOLUS (SEPSIS)
1000.0000 mL | Freq: Once | INTRAVENOUS | Status: AC
  Administered 2014-11-12: 1000 mL via INTRAVENOUS

## 2014-11-12 NOTE — ED Notes (Signed)
Provider at bedside

## 2014-11-12 NOTE — ED Notes (Signed)
Pt states he has been having headaches since June and 1 week ago started having blurry vision. Also reports diarrhea since June when he stopped taking a weight loss pill and "feels dehydration.

## 2014-11-12 NOTE — ED Provider Notes (Signed)
CSN: [id number]     Arrival date & time 11/12/14  1256 History   First MD Initiated Contact with Patient 11/12/14 1658     Chief Complaint  Patient presents with  . Headache     (Consider location/radiation/quality/duration/timing/severity/associated sxs/prior Treatment) Patient is a 24 y.o. male presenting with headaches. The history is provided by the patient and medical records. No language interpreter was used.  Headache Associated symptoms: no abdominal pain, no back pain, no cough, no diarrhea, no fatigue, no fever, no nausea, no neck stiffness and no vomiting      Jeremy Leonard is a 24 y.o. male  with a hx of HTN presents to the Emergency Department complaining of intermittent frontal headaches onset 2 mos ago.  Pt denies medical evaluation for this.  Pt reports that 1 week ago he developed blurry vision.  He has not seen an ophthalmologist.  He reports persistence of the blurry vision in spite of headache resolution.  Pt reports associated nausea withoiut vomiting.  Pt reports daily loose stools, sometimes watery since July 2015 but without abd pain.  Pt denies travel, weight loss or gain, bloody diarrhea (melena or hematochezia).  Pt denies fever, chills, neck pain, neck stiffness, night sweats, chest pain, SOB, abd pain, vomiting, weakness, dizziness, syncope, numbness, tingling. Pt reports taking ibuprofen which provides mild relief.  He reports nothing makes the symptoms worse.  Pt reports eating certain foods (but does not know which ones) makes the diarrhea worse.  Pt has a Hx of HTN but is not taking meds.  No meds in 5 years.    Past Medical History  Diagnosis Date  . Hypertension    Past Surgical History  Procedure Laterality Date  . Knee surgery      right  . Tonsillectomy     History reviewed. No pertinent family history. History  Substance Use Topics  . Smoking status: Never Smoker   . Smokeless tobacco: Not on file  . Alcohol Use: No    Review of Systems   Constitutional: Negative for fever, diaphoresis, appetite change, fatigue and unexpected weight change.  HENT: Negative for mouth sores.   Eyes: Negative for visual disturbance.  Respiratory: Negative for cough, chest tightness, shortness of breath and wheezing.   Cardiovascular: Negative for chest pain.  Gastrointestinal: Negative for nausea, vomiting, abdominal pain, diarrhea and constipation.  Endocrine: Negative for polydipsia, polyphagia and polyuria.  Genitourinary: Negative for dysuria, urgency, frequency and hematuria.  Musculoskeletal: Negative for back pain and neck stiffness.  Skin: Negative for rash.  Allergic/Immunologic: Negative for immunocompromised state.  Neurological: Positive for headaches. Negative for syncope and light-headedness.  Hematological: Does not bruise/bleed easily.  Psychiatric/Behavioral: Negative for sleep disturbance. The patient is not nervous/anxious.       Allergies  Review of patient's allergies indicates no known allergies.  Home Medications   Prior to Admission medications   Medication Sig Start Date End Date Taking? Authorizing Provider  predniSONE (DELTASONE) 10 MG tablet Take 5 tablets (50 mg total) by mouth daily. 05/18/14   Freddi Che, MD   BP 145/92 mmHg  Pulse 72  Temp(Src) 97.8 F (36.6 C) (Oral)  Resp 30  Ht 6\' 1"  (1.854 m)  Wt 350 lb (158.759 kg)  BMI 46.19 kg/m2  SpO2 99% Physical Exam  Constitutional: He is oriented to person, place, and time. He appears well-developed and well-nourished. No distress.  HENT:  Head: Normocephalic and atraumatic.  Mouth/Throat: Oropharynx is clear and moist.  Eyes: Conjunctivae and EOM  are normal. Pupils are equal, round, and reactive to light. No scleral icterus.  No horizontal, vertical or rotational nystagmus Visual acuity:  R Near: 20/100 ; L Near: 20/100  Neck: Normal range of motion. Neck supple.  Full active and passive ROM without pain No midline or paraspinal tenderness No  nuchal rigidity or meningeal signs  Cardiovascular: Normal rate, regular rhythm, normal heart sounds and intact distal pulses.   No murmur heard. Pulmonary/Chest: Effort normal and breath sounds normal. No respiratory distress. He has no wheezes. He has no rales.  Abdominal: Soft. Bowel sounds are normal. He exhibits no distension. There is no tenderness. There is no rebound and no guarding.  Musculoskeletal: Normal range of motion.  Lymphadenopathy:    He has no cervical adenopathy.  Neurological: He is alert and oriented to person, place, and time. He has normal reflexes. No cranial nerve deficit. He exhibits normal muscle tone. Coordination normal.  Mental Status:  Alert, oriented, thought content appropriate. Speech fluent without evidence of aphasia. Able to follow 2 step commands without difficulty.  Cranial Nerves:  II:  Peripheral visual fields grossly normal, pupils equal, round, reactive to light III,IV, VI: ptosis not present, extra-ocular motions intact bilaterally  V,VII: smile symmetric, facial light touch sensation equal VIII: hearing grossly normal bilaterally  IX,X: gag reflex present  XI: bilateral shoulder shrug equal and strong XII: midline tongue extension  Motor:  5/5 in upper and lower extremities bilaterally including strong and equal grip strength and dorsiflexion/plantar flexion Sensory: Pinprick and light touch normal in all extremities.  Deep Tendon Reflexes: 2+ and symmetric  Cerebellar: normal finger-to-nose with bilateral upper extremities Gait: normal gait and balance CV: distal pulses palpable throughout   Skin: Skin is warm and dry. No rash noted. He is not diaphoretic. No erythema.  Psychiatric: He has a normal mood and affect. His behavior is normal. Judgment and thought content normal.  Nursing note and vitals reviewed.   ED Course  Procedures (including critical care time) Labs Review Labs Reviewed  CBC WITH DIFFERENTIAL - Abnormal; Notable for  the following:    MCV 75.2 (*)    Lymphocytes Relative 47 (*)    All other components within normal limits  COMPREHENSIVE METABOLIC PANEL - Abnormal; Notable for the following:    Glucose, Bld 174 (*)    BUN 5 (*)    All other components within normal limits  URINALYSIS, ROUTINE W REFLEX MICROSCOPIC  CBG MONITORING, ED    Imaging Review Ct Head Wo Contrast  11/12/2014   CLINICAL DATA:  Patient with headache and blurred vision.  EXAM: CT HEAD WITHOUT CONTRAST  TECHNIQUE: Contiguous axial images were obtained from the base of the skull through the vertex without intravenous contrast.  COMPARISON:  None.  FINDINGS: Ventricles and sulci are appropriate for patient's age. No evidence for acute cortically based infarct, intracranial hemorrhage, mass lesion or mass effect. The orbits are unremarkable. Paranasal sinuses are unremarkable. Mastoid air cells are well aerated. Calvarium is intact.  IMPRESSION: No acute intracranial process.   Electronically Signed   By: Lovey Newcomer M.D.   On: 11/12/2014 18:28     EKG Interpretation None      MDM   Final diagnoses:  Headache  Blurry vision  Essential hypertension  Hyperglycemia without ketosis    Jeremy Leonard presents with complaints of intermittent frontal headaches for several months with blurry vision for one week.  Normal neurologic exam.  Headaches likely migrainous in nature however patient has a  diagnosis of this. He has never been evaluated for his headaches. Will obtain CT scan and give abortive therapy for headache.  Patient also complains of daily diarrhea for approximately 6 months. Labs reassuring without hypokalemia. No melena or hematochezia and patient without anemia.  Recommend follow-up with primary care physician for this.  Patient noted to be hypertensive in the emergency department.  He reports he has a history of this and is not taking medication for it. No signs of hypertensive urgency.  Discussed with patient the need  for close follow-up and management by their primary care physician.   6:52 PM Pt HA treated and improved while in ED.  patient reports resolution of his headache, blurry vision and nausea. He reports he feels significantly better and wishes for discharge home. Presentation is like pts typical HA and non concerning for [facility], ICH, Meningitis, or temporal arteritis. CT scan unremarkable. Pt is afebrile with no focal neuro deficits, nuchal rigidity, or change in vision. Pt is to follow up with PCP to discuss prophylactic medication. Pt verbalizes understanding and is agreeable with plan to dc.   I have personally reviewed patient's vitals, nursing note and any pertinent labs or imaging.  I performed an undressed physical exam.    It has been determined that no acute conditions requiring further emergency intervention are present at this time. The patient/guardian have been advised of the diagnosis and plan. I reviewed all labs and imaging including any potential incidental findings. We have discussed signs and symptoms that warrant return to the ED and they are listed in the discharge instructions.    Vital signs are stable at discharge.   BP 145/92 mmHg  Pulse 72  Temp(Src) 97.8 F (36.6 C) (Oral)  Resp 30  Ht 6\' 1"  (1.854 m)  Wt 350 lb (158.759 kg)  BMI 46.19 kg/m2  SpO2 99%         Abigail Butts, PA-C 11/12/14 Cecilia, PA-C 11/12/14 1853  Jasper Riling. Alvino Chapel, MD 11/13/14 [phone]

## 2014-11-12 NOTE — ED Notes (Signed)
Pt states he does not have a headache anymore and does not want the mag sulfate.

## 2014-11-12 NOTE — Discharge Instructions (Signed)
1. Medications: usual home medications 2. Treatment: rest, drink plenty of fluids,  3. Follow Up: Please followup with your primary doctor in 3 days for discussion of your diagnoses and further evaluation after today's visit; if you do not have a primary care doctor use the resource guide provided to find one; Please return to the ER for worsening headache, return earlier blurry vision, chest pain or shortness of breath or any other concerning symptoms   General Headache Without Cause A headache is pain or discomfort felt around the head or neck area. The specific cause of a headache may not be found. There are many causes and types of headaches. A few common ones are:  Tension headaches.  Migraine headaches.  Cluster headaches.  Chronic daily headaches. HOME CARE INSTRUCTIONS   Keep all follow-up appointments with your caregiver or any specialist referral.  Only take over-the-counter or prescription medicines for pain or discomfort as directed by your caregiver.  Lie down in a dark, quiet room when you have a headache.  Keep a headache journal to find out what may trigger your migraine headaches. For example, write down:  What you eat and drink.  How much sleep you get.  Any change to your diet or medicines.  Try massage or other relaxation techniques.  Put ice packs or heat on the head and neck. Use these 3 to 4 times per day for 15 to 20 minutes each time, or as needed.  Limit stress.  Sit up straight, and do not tense your muscles.  Quit smoking if you smoke.  Limit alcohol use.  Decrease the amount of caffeine you drink, or stop drinking caffeine.  Eat and sleep on a regular schedule.  Get 7 to 9 hours of sleep, or as recommended by your caregiver.  Keep lights dim if bright lights bother you and make your headaches worse. SEEK MEDICAL CARE IF:   You have problems with the medicines you were prescribed.  Your medicines are not working.  You have a change  from the usual headache.  You have nausea or vomiting. SEEK IMMEDIATE MEDICAL CARE IF:   Your headache becomes severe.  You have a fever.  You have a stiff neck.  You have loss of vision.  You have muscular weakness or loss of muscle control.  You start losing your balance or have trouble walking.  You feel faint or pass out.  You have severe symptoms that are different from your first symptoms. MAKE SURE YOU:   Understand these instructions.  Will watch your condition.  Will get help right away if you are not doing well or get worse. Document Released: 10/20/2005 Document Revised: 01/12/2012 Document Reviewed: 11/05/2011 [facility] Patient Information 2015 [place], [place]. This information is not intended to replace advice given to you by your health care provider. Make sure you discuss any questions you have with your health care provider.

## 2014-11-13 LAB — CBG MONITORING, ED: Glucose-Capillary: 170 mg/dL — ABNORMAL HIGH (ref 70–99)

## 2014-12-21 ENCOUNTER — Emergency Department (INDEPENDENT_AMBULATORY_CARE_PROVIDER_SITE_OTHER)
Admission: EM | Admit: 2014-12-21 | Discharge: 2014-12-21 | Disposition: A | Discharge status: Home / Self Care | Payer: Self-pay | Source: Home / Self Care | Attending: Emergency Medicine | Admitting: Emergency Medicine

## 2014-12-21 ENCOUNTER — Encounter (HOSPITAL_COMMUNITY): Payer: Self-pay | Admitting: Emergency Medicine

## 2014-12-21 DIAGNOSIS — I10 Essential (primary) hypertension: Secondary | ICD-10-CM

## 2014-12-21 DIAGNOSIS — R109 Unspecified abdominal pain: Secondary | ICD-10-CM

## 2014-12-21 DIAGNOSIS — R197 Diarrhea, unspecified: Secondary | ICD-10-CM

## 2014-12-21 LAB — POCT I-STAT, CHEM 8
BUN: 12 mg/dL (ref 6–23)
CREATININE: 1 mg/dL (ref 0.50–1.35)
Calcium, Ion: 1.3 mmol/L — ABNORMAL HIGH (ref 1.12–1.23)
Chloride: 98 mmol/L (ref 96–112)
GLUCOSE: 131 mg/dL — AB (ref 70–99)
HCT: 49 % (ref 39.0–52.0)
Hemoglobin: 16.7 g/dL (ref 13.0–17.0)
Potassium: 4.4 mmol/L (ref 3.5–5.1)
Sodium: 138 mmol/L (ref 135–145)
TCO2: 27 mmol/L (ref 0–100)

## 2014-12-21 LAB — CBC WITH DIFFERENTIAL/PLATELET
BASOS ABS: 0 10*3/uL (ref 0.0–0.1)
BASOS PCT: 0 % (ref 0–1)
EOS ABS: 0.1 10*3/uL (ref 0.0–0.7)
Eosinophils Relative: 1 % (ref 0–5)
HCT: 42.7 % (ref 39.0–52.0)
Hemoglobin: 14.7 g/dL (ref 13.0–17.0)
Lymphocytes Relative: 43 % (ref 12–46)
Lymphs Abs: 2.9 10*3/uL (ref 0.7–4.0)
MCH: 26.2 pg (ref 26.0–34.0)
MCHC: 34.4 g/dL (ref 30.0–36.0)
MCV: 76 fL — ABNORMAL LOW (ref 78.0–100.0)
MONO ABS: 0.3 10*3/uL (ref 0.1–1.0)
Monocytes Relative: 4 % (ref 3–12)
NEUTROS ABS: 3.4 10*3/uL (ref 1.7–7.7)
Neutrophils Relative %: 52 % (ref 43–77)
Platelets: 225 10*3/uL (ref 150–400)
RBC: 5.62 MIL/uL (ref 4.22–5.81)
RDW: 13.8 % (ref 11.5–15.5)
WBC: 6.6 10*3/uL (ref 4.0–10.5)

## 2014-12-21 LAB — POCT URINALYSIS DIP (DEVICE)
Bilirubin Urine: NEGATIVE
Glucose, UA: NEGATIVE mg/dL
HGB URINE DIPSTICK: NEGATIVE
Ketones, ur: NEGATIVE mg/dL
Leukocytes, UA: NEGATIVE
Nitrite: NEGATIVE
Protein, ur: NEGATIVE mg/dL
Specific Gravity, Urine: 1.02 (ref 1.005–1.030)
Urobilinogen, UA: 0.2 mg/dL (ref 0.0–1.0)
pH: 7.5 (ref 5.0–8.0)

## 2014-12-21 MED ORDER — HYDROCHLOROTHIAZIDE 25 MG PO TABS: 25.0000 mg | ORAL_TABLET | Freq: Every day | ORAL | Status: DC

## 2014-12-21 MED ORDER — METRONIDAZOLE 500 MG PO TABS
500.0000 mg | ORAL_TABLET | Freq: Two times a day (BID) | ORAL | Status: DC
Start: 2014-12-21 — End: 2015-02-22

## 2014-12-21 MED ORDER — CIPROFLOXACIN HCL 500 MG PO TABS: 500.0000 mg | ORAL_TABLET | Freq: Two times a day (BID) | ORAL | Status: DC

## 2014-12-21 MED ORDER — AMLODIPINE BESYLATE 5 MG PO TABS: 5.0000 mg | ORAL_TABLET | Freq: Every day | ORAL | Status: AC

## 2014-12-21 NOTE — Discharge Instructions (Signed)
Blood pressure over the ideal can put you at higher risk for stroke, heart disease, and kidney failure.  For this reason, it's important to try to get your blood pressure as close as possible to the ideal.  The ideal blood pressure is 120/80.  Blood pressures from 469-629 systolic over 52-84 diastolic are labeled as "prehypertension."  This means you are at higher risk of developing hypertension in the future.  Blood pressures in this range are not treated with medication, but lifestyle changes are recommended to prevent progression to hypertension.  Blood pressures of 132 and above systolic over 90 and above diastolic are classified as hypertension and are treated with medications.  Lifestyle changes which can benefit both prehypertension and hypertension include the following:   Salt and sodium restriction.  Weight loss.  Regular exercise.  Avoidance of tobacco.  Avoidance of excess alcohol.  The "D.A.S.H" diet.   People with hypertension and prehypertension should limit their salt intake to less than 1500 mg daily.  Reading the nutrition information on the label of many prepared foods can give you an idea of how much sodium you're consuming at each meal.  Remember that the most important number on the nutrition information is the serving size.  It may be smaller than you think.  Try to avoid adding extra salt at the table.  You may add small amounts of salt while cooking.  Remember that salt is an acquired taste and you may get used to a using a whole lot less salt than you are using now.  Using less salt lets the food's natural flavors come through.  You might want to consider using salt substitutes, potassium chloride, pepper, or blends of herbs and spices to enhance the flavor of your food.  Foods that contain the most salt include: processed meats (like ham, bacon, lunch meat, sausage, hot dogs, and breakfast meat), chips, pretzels, salted nuts, soups, salty snacks, canned foods, junk  food, fast food, restaurant food, mustard, pickles, pizza, popcorn, soy sauce, and worcestershire sauce--quite a list!  You might ask, "Is there anything I can eat?"  The answer is, "yes."  Fruits and vegetables are usually low in salt.  Fresh is better than frozen which is better than canned.  If you have canned vegetables, you can cut down on the salt content by rinsing them in tap water 3 times before cooking.     Weight loss is the second thing you can do to lower your blood pressure.  Getting to and maintaining ideal weight will often normalize your blood pressure and allow you to avoid medications, entirely, cut way down on your dosage of medications, or allow to wean off your meds.  (Note, this should only be done under the supervision of your primary care doctor.)  Of course, weight loss takes time and you may need to be on medication in the meantime.  You shoot for a body mass index of 20-25.  When you go to the urgent care or to your primary care doctor, they should calculate your BMI.  If you don't know what it is, ask.  You can calculate your BMI with the following formula:  Weight in pounds x 703/ (height in inches) x (height in inches).  There are many good diets out there: Weight Watchers and the D.A.S.H. Diet are the best, but often, just modifying a few factors can be helpful:  Don't skip meals, don't eat out, and keeping a food diary.  I do not recommend  fad diets or diet pills which often raise blood pressure.    Everyone should get regular exercise, but this is particularly important for people with high blood pressure.  Just about any exercise is good.  The only exercise which may be harmful is lifting extreme heavy weights.  I recommend moderate exercise such as walking for 30 minutes 5 days a week.  Going to the gym for a 50 minute workout 3 times a week is also good.  This amounts to 150 minutes of exercise weekly.   Anyone with high blood pressure should avoid any use of tobacco.   Tobacco use does not elevate blood pressure, but it increases the risk of heart disease and stroke.  If you are interested in quitting, discuss with your doctor how to quit.  If you are not interested in quitting, ask yourself, "What would my life be like in 10 years if I continue to smoke?"  "How will I know when it is time to quit?"  "How would my life be better if I were to quit."   Excess alcohol intake can raise the blood pressure.  The safe alcohol intake is 2 drinks or less per day for men and 1 drink per day or less for women.   There is a very good diet which I recommend that has been designed for people with blood pressure called the D.A.S.H. Diet (dietary approaches to stop hypertension).  It consists of fruits, vegetables, lean meats, low fat dairy, whole grains, nuts and seeds.  It is very low in salt and sodium.  It has also been found to have other beneficial health effects such as lowering cholesterol and helping lose weight.  It has been developed by the [employer] and can be downloaded from the internet without any cost. Just do a Development worker, community on "D.A.S.H. Diet." or go the NIH website (MasterBoxes.it).  There are also cookbooks and diet plans that can be gotten from [place] to help you with this diet.    Chronic Diarrhea Diarrhea is frequent loose and watery bowel movements. It can cause you to feel weak and dehydrated. Dehydration can cause you to become tired and thirsty and to have a dry mouth, decreased urination, and dark yellow urine. Diarrhea is a sign of another problem, most often an infection that will not last long. In most cases, diarrhea lasts 2-3 days. Diarrhea that lasts longer than 4 weeks is called long-lasting (chronic) diarrhea. It is important to treat your diarrhea as directed by your health care provider to lessen or prevent future episodes of diarrhea.  CAUSES  There are many causes of chronic diarrhea. The following are some possible causes:    Gastrointestinal infections caused by viruses, bacteria, or parasites.   Food poisoning or food allergies.   Certain medicines, such as antibiotics, chemotherapy, and laxatives.   Artificial sweeteners and fructose.   Digestive disorders, such as celiac disease and inflammatory bowel diseases.   Irritable bowel syndrome.  Some disorders of the pancreas.  Disorders of the thyroid.  Reduced blood flow to the intestines.  Cancer. Sometimes the cause of chronic diarrhea is unknown. RISK FACTORS  Having a severely weakened immune system, such as from HIV or AIDS.   Taking certain types of cancer-fighting drugs (such as with chemotherapy) or other medicines.   Having had a recent organ transplant.   Having a portion of the stomach or small bowel removed.   Traveling to countries where food and water supplies  are often contaminated.  SYMPTOMS  In addition to frequent, loose stools, diarrhea may cause:   Cramping.   Abdominal pain.   Nausea.   Fever.  Fatigue.  Urgent need to use the bathroom.  Loss of bowel control. DIAGNOSIS  Your health care provider must take a careful history and perform a physical exam. Tests given are based on your symptoms and history. Tests may include:   Blood or stool tests. Three or more stool samples may be examined. Stool cultures may be used to test for bacteria or parasites.   X-rays.   A procedure in which a thin tube is inserted into the mouth or rectum (endoscopy). This allows the health care provider to look inside the intestine.  TREATMENT   Treatment is aimed at correcting the cause of the diarrhea when possible.  Diarrhea caused by an infection can often be treated with antibiotic medicines.  Diarrhea not caused by an infection may require you to take long-term medicine or have surgery. Specific treatment should be discussed with your health care provider.  If the cause cannot be determined, treatment  aims to relieve symptoms and prevent dehydration. Serious health problems can occur if you do not maintain proper fluid levels. Treatment may include:  Taking an oral rehydration solution (ORS).  Not drinking beverages that contain caffeine (such as tea, coffee, and soft drinks).  Not drinking alcohol.  Maintaining well-balanced nutrition to help you recover faster. HOME CARE INSTRUCTIONS   Drink enough fluids to keep urine clear or pale yellow. Drink 1 cup (8 oz) of fluid for each diarrhea episode. Avoid fluids that contain simple sugars, fruit juices, whole milk products, and sodas. Hydrate with an ORS. You may purchase the ORS or prepare it at home by mixing the following ingredients together:   - tsp (1.7-3  mL) table salt.   tsp (3  mL) baking soda.   tsp (1.7 mL) salt substitute containing potassium chloride.  1 tbsp (20 mL) sugar.  4.2 c (1 L) of water.   Certain foods and beverages may increase the speed at which food moves through the gastrointestinal (GI) tract. These foods and beverages should be avoided. They include:  Caffeinated and alcoholic beverages.  High-fiber foods, such as raw fruits and vegetables, nuts, seeds, and whole grain breads and cereals.  Foods and beverages sweetened with sugar alcohols, such as xylitol, sorbitol, and mannitol.   Some foods may be well tolerated and may help thicken stool. These include:  Starchy foods, such as rice, toast, pasta, low-sugar cereal, oatmeal, grits, baked potatoes, crackers, and bagels.  Bananas.  Applesauce.  Add probiotic-rich foods to help increase healthy bacteria in the GI tract. These include yogurt and fermented milk products.  Wash your hands well after each diarrhea episode.  Only take over-the-counter or prescription medicines as directed by your health care provider.  Take a warm bath to relieve any burning or pain from frequent diarrhea episodes. SEEK MEDICAL CARE IF:   You are not  urinating as often.  Your urine is a dark color.  You become very tired or dizzy.  You have severe pain in the abdomen or rectum.  Your have blood or pus in your stools.  Your stools look black and tarry. SEEK IMMEDIATE MEDICAL CARE IF:   You are unable to keep fluids down.  You have persistent vomiting.  You have blood in your stool.  Your stools are black and tarry.  You do not urinate in 6-8 hours, or  there is only a small amount of very dark urine.  You have abdominal pain that increases or localizes.  You have weakness, dizziness, confusion, or lightheadedness.  You have a severe headache.  Your diarrhea gets worse or does not get better.  You have a fever or persistent symptoms for more than 2-3 days.  You have a fever and your symptoms suddenly get worse. MAKE SURE YOU:   Understand these instructions.  Will watch your condition.  Will get help right away if you are not doing well or get worse. Document Released: 01/10/2004 Document Revised: 10/25/2013 Document Reviewed: 04/14/2013 [facility] Patient Information 2015 [place], [place]. This information is not intended to replace advice given to you by your health care provider. Make sure you discuss any questions you have with your health care provider.

## 2014-12-21 NOTE — ED Provider Notes (Signed)
Chief Complaint   Pain   History of Present Illness   Jeremy Leonard is a 24 year old male who has had a history since last July of intermittent diarrhea. This can occur up to 4 times a day and the stool is sometimes green in color. He denies any blood in the stool. Over the past 4 weeks he's had pain in his left flank. It comes and goes. Is worse if he sits. Is been as high as a 10 over 10 but now is a 6/10 in intensity. He denies any fever, chills, anorexia, weight loss. He's occasionally felt nauseated and vomited a couple times. He denies any urinary symptoms. No history of diverticulitis, colitis, ulcer disease, or gastritis.  Review of Systems   Other than as noted above, the patient denies any of the following symptoms: Constitutional:  No fever, chills, weight loss or anorexia. Abdomen:  No nausea, vomiting, hematememesis, melena, diarrhea, or hematochezia. GU:  No dysuria, frequency, urgency, or hematuria.  No testicular pain or swelling.  [place]   Past medical history, family history, social history, meds, and allergies were reviewed.   Physical Examination     Vital signs:  BP 176/123 mmHg  Pulse 92  Temp(Src) 98 F (36.7 C) (Oral)  Resp 16  SpO2 98% Gen:  Alert, oriented, in no distress. Lungs:  Breath sounds clear and equal bilaterally.  No wheezes, rales or rhonchi. Heart:  Regular rhythm.  No gallops or murmers.   Abdomen:  Soft, flat, nondistended. No organomegaly or mass. Bowel sounds were normally active to hyperactive. There is moderate pain to palpation in the left upper quadrant and left flank areas. No pain to palpation epigastrium, right upper quadrant, right flank, right lower quadrant, suprapubic area, the right lower quadrant. No guarding or rebound. Skin:  Clear, warm and dry.  No rash.  Labs   Results for orders placed or performed during the hospital encounter of 12/21/14  CBC with Differential/Platelet  Result Value Ref Range   WBC 6.6 4.0 - 10.5  K/uL   RBC 5.62 4.22 - 5.81 MIL/uL   Hemoglobin 14.7 13.0 - 17.0 g/dL   HCT 42.7 39.0 - 52.0 %   MCV 76.0 (L) 78.0 - 100.0 fL   MCH 26.2 26.0 - 34.0 pg   MCHC 34.4 30.0 - 36.0 g/dL   RDW 13.8 11.5 - 15.5 %   Platelets 225 150 - 400 K/uL   Neutrophils Relative % 52 43 - 77 %   Neutro Abs 3.4 1.7 - 7.7 K/uL   Lymphocytes Relative 43 12 - 46 %   Lymphs Abs 2.9 0.7 - 4.0 K/uL   Monocytes Relative 4 3 - 12 %   Monocytes Absolute 0.3 0.1 - 1.0 K/uL   Eosinophils Relative 1 0 - 5 %   Eosinophils Absolute 0.1 0.0 - 0.7 K/uL   Basophils Relative 0 0 - 1 %   Basophils Absolute 0.0 0.0 - 0.1 K/uL  I-STAT, chem 8  Result Value Ref Range   Sodium 138 135 - 145 mmol/L   Potassium 4.4 3.5 - 5.1 mmol/L   Chloride 98 96 - 112 mmol/L   BUN 12 6 - 23 mg/dL   Creatinine, Ser 1.00 0.50 - 1.35 mg/dL   Glucose, Bld 131 (H) 70 - 99 mg/dL   Calcium, Ion 1.30 (H) 1.12 - 1.23 mmol/L   TCO2 27 0 - 100 mmol/L   Hemoglobin 16.7 13.0 - 17.0 g/dL   HCT 49.0 39.0 - 52.0 %  POCT urinalysis dip (device)  Result Value Ref Range   Glucose, UA NEGATIVE NEGATIVE mg/dL   Bilirubin Urine NEGATIVE NEGATIVE   Ketones, ur NEGATIVE NEGATIVE mg/dL   Specific Gravity, Urine 1.020 1.005 - 1.030   Hgb urine dipstick NEGATIVE NEGATIVE   pH 7.5 5.0 - 8.0   Protein, ur NEGATIVE NEGATIVE mg/dL   Urobilinogen, UA 0.2 0.0 - 1.0 mg/dL   Nitrite NEGATIVE NEGATIVE   Leukocytes, UA NEGATIVE NEGATIVE    Assessment   The primary encounter diagnosis was Diarrhea. Diagnoses of Left flank pain and Essential hypertension were also pertinent to this visit.  He has chronic diarrhea with left flank and left upper quadrant pain. Differential diagnosis includes bacterial gastroenteritis, inflammatory bowel disease, and diverticulitis, although I don't think his diverticulitis since he has no fever, symptoms are chronic, his white count was normal. There is no evidence of GI bleeding. His other issue is uncontrolled hypertension. He's not  taking anything for this right now. A stool culture is pending as well as C. difficile PCR. If antibiotics don't work, the next step is to see a gastroenterologist. He needs to get in to [facility] and get an orange card, since he doesn't have any insurance.  Plan     1.  Meds:  The following meds were prescribed:   Discharge Medication List as of 12/21/2014  5:21 PM    START taking these medications   Details  amLODipine (NORVASC) 5 MG tablet Take 1 tablet (5 mg total) by mouth daily., Starting 12/21/2014, Until Discontinued, Normal    ciprofloxacin (CIPRO) 500 MG tablet Take 1 tablet (500 mg total) by mouth every 12 (twelve) hours., Starting 12/21/2014, Until Discontinued, Normal    hydrochlorothiazide (HYDRODIURIL) 25 MG tablet Take 1 tablet (25 mg total) by mouth daily., Starting 12/21/2014, Until Discontinued, Normal    metroNIDAZOLE (FLAGYL) 500 MG tablet Take 1 tablet (500 mg total) by mouth 2 (two) times daily., Starting 12/21/2014, Until Discontinued, Normal        2.  Patient Education/Counseling:  The patient was given appropriate handouts, self care instructions, and instructed in symptomatic relief.    3.  Follow up:  The patient was told to follow up here if no better in 2 to 3 days, or sooner if becoming worse in any way, and given some red flag symptoms such as worsening pain, persistent vomiting, fever, or evidence of GI bleeding which would prompt immediate return.       Harden Mo, MD 12/21/14 2128

## 2014-12-28 ENCOUNTER — Encounter: Payer: Self-pay | Admitting: Gastroenterology

## 2015-01-04 ENCOUNTER — Encounter: Payer: Self-pay | Admitting: Gastroenterology

## 2015-01-04 ENCOUNTER — Other Ambulatory Visit (INDEPENDENT_AMBULATORY_CARE_PROVIDER_SITE_OTHER): Payer: BLUE CROSS/BLUE SHIELD

## 2015-01-04 ENCOUNTER — Ambulatory Visit (INDEPENDENT_AMBULATORY_CARE_PROVIDER_SITE_OTHER): Payer: BLUE CROSS/BLUE SHIELD | Admitting: Gastroenterology

## 2015-01-04 VITALS — BP 160/100 | HR 99 | Ht 73.0 in | Wt 360.0 lb

## 2015-01-04 DIAGNOSIS — R197 Diarrhea, unspecified: Secondary | ICD-10-CM

## 2015-01-04 LAB — IGA: IGA: 137 mg/dL (ref 68–378)

## 2015-01-04 LAB — TSH: TSH: 1.21 u[IU]/mL (ref 0.35–4.50)

## 2015-01-04 LAB — HIGH SENSITIVITY CRP: CRP HIGH SENSITIVITY: 4.08 mg/L (ref 0.000–5.000)

## 2015-01-04 MED ORDER — METRONIDAZOLE 500 MG PO TABS: 500.0000 mg | ORAL_TABLET | Freq: Two times a day (BID) | ORAL | Status: DC

## 2015-01-04 NOTE — Progress Notes (Signed)
01/04/2015 Jeremy Leonard [id number] 08-Jan-1991   HISTORY OF PRESENT ILLNESS:  This is a pleasant 24 year old male who is new to our practice and has been referred here by Urgent Care MD, Dr. Jake Michaelis, for evaluation of diarrhea.  He says that the diarrhea started somewhat suddenly in July 2015 and has been persistent since that time.  Has about 4 episodes of loose/watery stools daily.  Denies seeing any blood.  Has missed work because of the diarrhea.  Has some left sided abdominal discomfort at times, but no lower abdominal pains.  He denies any travel, etc prior to the onset of the diarrhea.  He says that he has diarrhea no matter what he eats, but does notice slight worsening with dairy products/milk.  Stool studies were ordered recently but he did not turn them in.  He was empirically treated with a 7 day course of cipro 500 mg BID and flagyl 500 mg BID recently (just completed them a couple of days ago), but did not notice any significant difference in his symptoms.  CBC and CMP unremarkable recently.  Was also recently started on two medications for his elevated BP.     Past Medical History  Diagnosis Date  . Hypertension    Past Surgical History  Procedure Laterality Date  . Knee surgery      right  . Tonsillectomy      reports that he has never smoked. He does not have any smokeless tobacco history on file. He reports that he drinks alcohol. He reports that he does not use illicit drugs. family history is not on file. No Known Allergies    Outpatient Encounter Prescriptions as of 01/04/2015  Medication Sig  . amLODipine (NORVASC) 5 MG tablet Take 1 tablet (5 mg total) by mouth daily.  . ciprofloxacin (CIPRO) 500 MG tablet Take 1 tablet (500 mg total) by mouth every 12 (twelve) hours.  . hydrochlorothiazide (HYDRODIURIL) 25 MG tablet Take 1 tablet (25 mg total) by mouth daily.  . metroNIDAZOLE (FLAGYL) 500 MG tablet Take 1 tablet (500 mg total) by mouth 2 (two) times daily.  .  predniSONE (DELTASONE) 10 MG tablet Take 5 tablets (50 mg total) by mouth daily.  . metroNIDAZOLE (FLAGYL) 500 MG tablet Take 1 tablet (500 mg total) by mouth 2 (two) times daily.     REVIEW OF SYSTEMS  : All other systems reviewed and negative except where noted in the History of Present Illness.   PHYSICAL EXAM: BP 160/100 mmHg  Pulse 99  Ht 6\' 1"  (1.854 m)  Wt 360 lb (163.295 kg)  BMI 47.51 kg/m2  SpO2 98% General: Well developed black male in no acute distress Head: Normocephalic and atraumatic Eyes:  Sclerae anicteric, conjunctiva pink. Ears: Normal auditory acuity Lungs: Clear throughout to auscultation Heart: Regular rate and rhythm Abdomen: Soft, non-distended.  Normal bowel sounds.  Mild left sided TTP without R/R/G. Musculoskeletal: Symmetrical with no gross deformities  Skin: No lesions on visible extremities Extremities: No edema  Neurological: Alert oriented x 4, grossly non-focal Psychological:  Alert and cooperative. Normal mood and affect  ASSESSMENT AND PLAN: -Diarrhea, chronic:  Present and persistent since July.  Differential includes celiac disease, lactose intolerance (suspect that he has this and it may be contributing to his symptoms at times but unsure if it is the entire issue), IBD, IBS/SIBO, etc.  Will check labs including TSH, high-sensitivity CRP, and celiac studies.  Will also check stool GI pathogen panel.  Will  try another course of Flagyl 500 mg BID for 10 days.  Can also use Imodium prn for now.  May need colonoscopy if symptoms persist and other evaluation is negative.  Discussed with Dr. Henrene Pastor.  He will also follow-up with Dr. Henrene Pastor in about 4-6 weeks.  CC:  Dr. Philipp Deputy

## 2015-01-04 NOTE — Patient Instructions (Addendum)
Go to the basement for labs today  Your follow up with Dr Henrene Pastor is scheduled on 02/13/2015 at 9:15am  We sent in your prescription to your pharmacy

## 2015-01-04 NOTE — Progress Notes (Signed)
Case discussed with advanced practitioner. Agree with initial assessment and plans for follow-up

## 2015-01-05 LAB — TISSUE TRANSGLUTAMINASE, IGA: TISSUE TRANSGLUTAMINASE AB, IGA: 1 U/mL (ref <4)

## 2015-01-08 ENCOUNTER — Telehealth: Payer: Self-pay | Admitting: Gastroenterology

## 2015-01-08 ENCOUNTER — Telehealth: Payer: Self-pay | Admitting: Internal Medicine

## 2015-01-08 NOTE — Telephone Encounter (Signed)
Spoke with patient and told him the blood work was normal but has not been reviewed by Alonza Bogus, PA as of yet. Reminded patient to bring in stool study to complete the evaluation.

## 2015-01-08 NOTE — Telephone Encounter (Signed)
Not needed

## 2015-01-09 ENCOUNTER — Other Ambulatory Visit: Payer: BLUE CROSS/BLUE SHIELD

## 2015-01-09 DIAGNOSIS — R197 Diarrhea, unspecified: Secondary | ICD-10-CM

## 2015-01-10 NOTE — Telephone Encounter (Signed)
Spoke with patient and told him the stool study results are not back yet.

## 2015-01-15 ENCOUNTER — Telehealth: Payer: Self-pay | Admitting: Internal Medicine

## 2015-01-15 LAB — OTHER SOLSTAS TEST: Miscellaneous Test: 65008

## 2015-01-15 NOTE — Telephone Encounter (Signed)
Discussed with pt that the results are not back yet.

## 2015-02-13 ENCOUNTER — Ambulatory Visit: Payer: BLUE CROSS/BLUE SHIELD | Admitting: Internal Medicine

## 2015-02-22 ENCOUNTER — Encounter: Payer: Self-pay | Admitting: Internal Medicine

## 2015-02-22 ENCOUNTER — Ambulatory Visit (INDEPENDENT_AMBULATORY_CARE_PROVIDER_SITE_OTHER): Payer: BLUE CROSS/BLUE SHIELD | Admitting: Internal Medicine

## 2015-02-22 VITALS — BP 138/72 | HR 88 | Ht 73.0 in | Wt 338.4 lb

## 2015-02-22 DIAGNOSIS — R197 Diarrhea, unspecified: Secondary | ICD-10-CM | POA: Diagnosis not present

## 2015-02-22 DIAGNOSIS — Z87898 Personal history of other specified conditions: Secondary | ICD-10-CM

## 2015-02-22 DIAGNOSIS — Z8719 Personal history of other diseases of the digestive system: Secondary | ICD-10-CM

## 2015-02-22 NOTE — Patient Instructions (Signed)
Please follow up with Dr. Perry as needed 

## 2015-02-22 NOTE — Progress Notes (Signed)
HISTORY OF PRESENT ILLNESS:  Jeremy Leonard is a 24 y.o. male with morbid obesity who was initially evaluated 01/04/2015 regarding an 8 month history of chronic diarrhea. See that dictation for details. Multiple laboratories were obtained and returned unremarkable including celiac testing. Stool studies for enteric pathogens are negative. He was prescribed an empiric course of metronidazole 500 mg twice a day for 10 days. As well, dietary changes recommended. He presents today for follow-up. Patient is pleased to report that he has had resolution of his problems. Bowel habits daily are regular and formed. Approximately once per week he will notice loose stools. Since initiating dietary change and instituting an exercise program, he has lost over 20 pounds. He has no new complaints and his GI review of systems is negative.  REVIEW OF SYSTEMS:  All non-GI ROS negative entirely  Past Medical History  Diagnosis Date  . Hypertension     Past Surgical History  Procedure Laterality Date  . Knee surgery      right  . Tonsillectomy      Social History Jeremy Leonard  reports that he has never smoked. He does not have any smokeless tobacco history on file. He reports that he drinks alcohol. He reports that he does not use illicit drugs.  family history is not on file.  No Known Allergies     PHYSICAL EXAMINATION: Vital signs: BP 138/72 mmHg  Pulse 88  Ht 6\' 1"  (1.854 m)  Wt 338 lb 6.4 oz (153.497 kg)  BMI 44.66 kg/m2 General: Well-developed, obese, well-nourished, no acute distress HEENT: Sclerae are anicteric, conjunctiva pink. Oral mucosa intact Lungs: Clear Heart: Regular Abdomen: soft, obese, nontender, nondistended, no obvious ascites, no peritoneal signs, normal bowel sounds. No organomegaly. Extremities: No edema Psychiatric: alert and oriented x3. Cooperative and pleasant     ASSESSMENT:  #1. Recent problems with chronic diarrhea resolved. Seemingly a response to  metronidazole. #2. Morbid obesity. The patient has been successful with weight loss with dietary changes and exercise   PLAN:  #1. Possible causes for diarrhea reviewed. These include occult infection, picture overgrowth, or dietary related #2. Continue exercise and weight loss. Importance of this for his overall health long-term stressed. He understood #3. Return to her PCP for ongoing management of your general health. GI follow-up as needed  15 minutes was spent face-to-face with this patient. Greater than 50% of the time spent with counseling from his conditions

## 2015-02-27 ENCOUNTER — Telehealth: Payer: Self-pay | Admitting: Internal Medicine

## 2015-02-27 NOTE — Telephone Encounter (Signed)
Spoke with pt and he is aware and knows to call back in one week with an update.

## 2015-02-27 NOTE — Telephone Encounter (Signed)
Continue to monitor for now. Okay to use Imodium. Make sure he is continuing with his exercise, diet, and weight loss. Have him give [place] follow-up on his condition in one week.

## 2015-02-27 NOTE — Telephone Encounter (Signed)
Pt states that after his OV last week his diarrhea started back on Friday. Pt states he is having 3 liquid diarrhea stools/day again. Pt states he was told to call back if the diarrhea resumed. Please advise.

## 2015-03-07 ENCOUNTER — Telehealth: Payer: Self-pay | Admitting: Internal Medicine

## 2015-03-07 NOTE — Telephone Encounter (Signed)
Pt states his diarrhea was very bad last night after he drank a glass of milk. Asked pt if he was taking imodium. Pt states he is out of it, discussed with pt that this is otc and he needs to purchase more of the imodium. Discussed with pt that he should avoid dairy also. Pt will call back with an update in a week.

## 2015-03-13 ENCOUNTER — Telehealth: Payer: Self-pay | Admitting: Internal Medicine

## 2015-03-13 NOTE — Telephone Encounter (Signed)
Pt states he has been taking Imodium for diarrhea and now he is having problems with constipation and lots of bloating. Discussed with pt that we are having problems taking care of this over the phone and perhaps he should be seen. Pt scheduled to see Lori Hvozdovic, PA-C 03/16/15@2 :45pm. Pt aware of appt.

## 2015-03-14 ENCOUNTER — Encounter: Payer: Self-pay | Admitting: Gastroenterology

## 2015-03-16 ENCOUNTER — Encounter: Payer: Self-pay | Admitting: Physician Assistant

## 2015-03-16 ENCOUNTER — Ambulatory Visit (INDEPENDENT_AMBULATORY_CARE_PROVIDER_SITE_OTHER): Payer: BLUE CROSS/BLUE SHIELD | Admitting: Physician Assistant

## 2015-03-16 ENCOUNTER — Telehealth: Payer: Self-pay | Admitting: Physician Assistant

## 2015-03-16 VITALS — BP 142/96 | HR 88 | Ht 73.0 in | Wt 351.0 lb

## 2015-03-16 DIAGNOSIS — I10 Essential (primary) hypertension: Secondary | ICD-10-CM | POA: Diagnosis not present

## 2015-03-16 DIAGNOSIS — K589 Irritable bowel syndrome without diarrhea: Secondary | ICD-10-CM | POA: Diagnosis not present

## 2015-03-16 MED ORDER — DICYCLOMINE HCL 20 MG PO TABS: 20.0000 mg | ORAL_TABLET | Freq: Three times a day (TID) | ORAL | Status: AC | PRN

## 2015-03-16 MED ORDER — METRONIDAZOLE 500 MG PO TABS: 500.0000 mg | ORAL_TABLET | Freq: Three times a day (TID) | ORAL | Status: DC

## 2015-03-16 NOTE — Patient Instructions (Signed)
We have sent the following medications to your pharmacy for you to pick up at your convenience: Bentyl 20 mg 1 tablet three times daily as needed for cramps/spasm Flagyl 500 mg three times daily x 10 day  Make sure to eat plenty of "P" fruits: peaches, pears, prunes, plums, pineapple  Please purchase the following medications over the counter and take as directed: Benefiber 1 heaping teaspoon daily  Please follow up with Primary Care about your elevated blood pressure.  We have scheduled an appointment for you to see Elyn Aquas, PA (primary care) on Friday, 03/23/15 @ 8:00 am. Their information is as follows: [facility] [place] . [address] [place] [address] [phone] . Fax [phone] . [phone]   You have been scheduled for a follow up with Dr Henrene Pastor on Wednesday, 05/16/15 @ 10:45 am.

## 2015-03-16 NOTE — Telephone Encounter (Signed)
A user error has taken place.

## 2015-03-16 NOTE — Progress Notes (Signed)
Patient ID: Jeremy Leonard, male   DOB: May 13, 1991, 24 y.o.   MRN: [id number]     History of Present Illness: Jeremy Leonard is a very pleasant 24 year old morbidly obese African American male who was last seen here in April 2016. Initially he was evaluated in March with an 8 month history of chronic diarrhea. Multiple laboratories were obtained and fortunately were unremarkable. Stool studies for enteric pathogens were negative. He was given an empiric trial of metronidazole 500 mg twice daily for 10 days and his diarrhea stopped. At his last visit he was having regular formed bowel movement on a daily basis with a loose stool perhaps once weekly. He states that several days after his last visit he began to have frequent loose stools with a lot of gas and bloating. He notes that if he has dairy products his gas and loose stools are worse. He has tried to eliminate dairy products but has not noted a big difference. He has had no bright red blood per rectum or melena. Currently he says he is having days of loose stools alternating with days of nugget-like stools with a lot of gas and bloating. His appetite as been good and his father, who is 77 years old, was diagnosed with colon cancer 3 or 4 weeks ago when had surgery. Jeremy Leonard says he is not sure if his father will need chemotherapy or radiation. Desmonds blood pressure has been noted to be high today as well he states he was seen in the emergency room for headaches and found to have an elevated blood pressure he then was seen in an urgent care facility with elevated blood pressure and was started on amlodipine 5 mg but has never been established with a primary care doctor. He does get occasional headaches and in the past has had episodes of blurred vision but is not having those symptoms now. He asks for assistance in finding a primary care provider.   Past Medical History  Diagnosis Date  . Hypertension     Past Surgical History  Procedure Laterality Date    . Knee surgery Right   . Tonsillectomy     Family History  Problem Relation Age of Onset  . Colon cancer Father 24    diagnosed last week   . Diabetes Mother   . Hypertension      multiple family members   History  Substance Use Topics  . Smoking status: Never Smoker   . Smokeless tobacco: Not on file  . Alcohol Use: No   Current Outpatient Prescriptions  Medication Sig Dispense Refill  . amLODipine (NORVASC) 5 MG tablet Take 1 tablet (5 mg total) by mouth daily. 30 tablet 2  . loperamide (IMODIUM) 2 MG capsule Take 2 mg by mouth as needed for diarrhea or loose stools.    . dicyclomine (BENTYL) 20 MG tablet Take 1 tablet (20 mg total) by mouth 3 (three) times daily as needed (cramps). 60 tablet 1  . metroNIDAZOLE (FLAGYL) 500 MG tablet Take 1 tablet (500 mg total) by mouth 3 (three) times daily. 30 tablet 0   No current facility-administered medications for this visit.   No Known Allergies    Review of Systems: Per history of present illness otherwise negative.   Physical Exam: General: Pleasant, morbidly obese African-American male male in no acute distress Head: Normocephalic and atraumatic Eyes:  sclerae anicteric, conjunctiva pink  Ears: Normal auditory acuity Lungs: Clear throughout to auscultation Heart: Regular rate and rhythm Abdomen: Soft, non distended,  non-tender. No masses, no hepatomegaly. Normal bowel sound Musculoskeletal: Symmetrical with no gross deformities  Extremities: No edema  Neurological: Alert oriented x 4, grossly nonfocal Psychological:  Alert and cooperative. Normal mood and affect  Assessment and Recommendations: #1. Stools alternating between formed and loose, suggestive of IBS. He's been instructed to adhere to a high-fiber low-fat diet, to add "P fruits to his diet, and to try to use Benefiber a heaping tablespoon one or 2 times daily to try to bulk his stools. As he has been having a lot of gas he will again be given an empiric trial  of metronidazole 500 mg 3 times a day for 10 days for possible bacterial overgrowth. He will follow up in 2 months, sooner if needed.  #2. Hypertension. Patient has been instructed to continue exercising and to try to lose weight. He's been urged to adhere to a low sodium diet. An appointment  will be made for him to follow up with the primary care provider to establish care and management his hypertension.        Jeremy Leonard, Jeremy Leonard 03/16/2015,

## 2015-03-19 NOTE — Progress Notes (Signed)
Agree with assessment and plans as outlined 

## 2015-03-22 ENCOUNTER — Encounter: Payer: Self-pay | Admitting: *Deleted

## 2015-03-22 ENCOUNTER — Telehealth: Payer: Self-pay | Admitting: *Deleted

## 2015-03-22 NOTE — Telephone Encounter (Signed)
Pre-Visit Call completed with patient and chart updated.   Pre-Visit Info documented in Specialty Comments under [employer].    

## 2015-03-23 ENCOUNTER — Ambulatory Visit: Payer: BLUE CROSS/BLUE SHIELD | Admitting: Physician Assistant

## 2015-04-03 ENCOUNTER — Telehealth: Payer: Self-pay | Admitting: Physician Assistant

## 2015-04-03 MED ORDER — METRONIDAZOLE 500 MG PO TABS: 500.0000 mg | ORAL_TABLET | Freq: Three times a day (TID) | ORAL | Status: DC

## 2015-04-03 NOTE — Telephone Encounter (Signed)
Pt calling stating that flagyl has helped with diarrhea. Pt would like to have a refill on flagyl. Please advise.

## 2015-04-03 NOTE — Telephone Encounter (Signed)
Refill sent to pharmacy.   

## 2015-04-03 NOTE — Telephone Encounter (Signed)
He can have one refill  

## 2015-04-05 ENCOUNTER — Telehealth: Payer: Self-pay | Admitting: General Practice

## 2015-04-05 ENCOUNTER — Encounter: Payer: Self-pay | Admitting: Physician Assistant

## 2015-04-05 NOTE — Telephone Encounter (Signed)
Pt was no show for new pt appointment 03/23/15- letter sent. No charge.

## 2015-05-16 ENCOUNTER — Encounter: Payer: Self-pay | Admitting: Internal Medicine

## 2015-05-16 ENCOUNTER — Ambulatory Visit (INDEPENDENT_AMBULATORY_CARE_PROVIDER_SITE_OTHER): Payer: BLUE CROSS/BLUE SHIELD | Admitting: Internal Medicine

## 2015-05-16 VITALS — BP 160/100 | HR 72 | Ht 70.75 in | Wt 349.5 lb

## 2015-05-16 DIAGNOSIS — R197 Diarrhea, unspecified: Secondary | ICD-10-CM | POA: Diagnosis not present

## 2015-05-16 DIAGNOSIS — R194 Change in bowel habit: Secondary | ICD-10-CM | POA: Diagnosis not present

## 2015-05-16 DIAGNOSIS — R1084 Generalized abdominal pain: Secondary | ICD-10-CM | POA: Diagnosis not present

## 2015-05-16 DIAGNOSIS — Z800 Family history of malignant neoplasm of digestive organs: Secondary | ICD-10-CM | POA: Diagnosis not present

## 2015-05-16 MED ORDER — NA SULFATE-K SULFATE-MG SULF 17.5-3.13-1.6 GM/177ML PO SOLN: 1.0000 | Freq: Once | ORAL | Status: DC

## 2015-05-16 NOTE — Progress Notes (Signed)
HISTORY OF PRESENT ILLNESS:  Jeremy Leonard is a 24 y.o. male re-presents today for evaluation of difficulties with his bowel movements. He was initially evaluated in March regarding an 8 month history of chronic diarrhea. See that dictation for details. Follow-up in April after a trial of metronidazole found him to be doing well. Seemingly had a relapse and was evaluated by the physician assistant in May. Retreated with metronidazole with this follow-up appointment. His father was diagnosed with colon cancer in his early 15s. Patient did have blood work earlier this year that revealed decreased MCV with normal hemoglobin. Currently he reports more difficulties with his bowels. They are rather soft or constipated. He denies any change in medication. 2 days ago he initiated a "[employer]" which is basically balanced with limited caloric intake. He does note some abdominal discomfort or "tightness" which is new. This concerns him.  REVIEW OF SYSTEMS:  All non-GI ROS negative except for anxiety  Past Medical History  Diagnosis Date  . Hypertension   . IBS (irritable bowel syndrome)   . Morbid obesity     Past Surgical History  Procedure Laterality Date  . Knee surgery Right   . Tonsillectomy      Social History [name]  reports that he has never smoked. He does not have any smokeless tobacco history on file. He reports that he does not drink alcohol or use illicit drugs.  family history includes Cancer (age of onset: 39) in his father; Colon cancer (age of onset: 74) in his father; Diabetes in his mother; Hypertension in an other family member.  No Known Allergies     PHYSICAL EXAMINATION:Vital signs: BP 160/100 mmHg  Pulse 72  Ht 5' 10.75" (1.797 m)  Wt 349 lb 8 oz (158.532 kg)  BMI 49.09 kg/m2  Constitutional: generally well-appearing, no acute distress Psychiatric: alert and oriented x3, cooperative Eyes: extraocular movements intact, anicteric, conjunctiva pink Mouth:  oral pharynx moist, no lesions Neck: supple no lymphadenopathy Cardiovascular: heart regular rate and rhythm, no murmur Lungs: clear to auscultation bilaterally Abdomen: soft, nontender, nondistended, no obvious ascites, no peritoneal signs, normal bowel sounds, no organomegaly Rectal: Deferred until colonoscopy Extremities: no clubbing cyanosis or edema lower extremity edema bilaterally Skin: no lesions on visible extremities Neuro: No focal deficits.   ASSESSMENT:  #1. Change in bowel habits. Patient was having problems with loose stools. Seemingly improved after second course of metronidazole. Now with constipation and associated abdominal discomfort #2. Family history of colon cancer in his father #3. Morbid obesity   PLAN:  #1. Colonoscopy to evaluate change in bowel habits and abdominal pain. As well family history of colon cancer in his father.The nature of the procedure, as well as the risks, benefits, and alternatives were carefully and thoroughly reviewed with the patient. Ample time for discussion and questions allowed. The patient understood, was satisfied, and agreed to proceed. #2. If endoscopic evaluation unrevealing we will discuss strategies to maximize bowel performance

## 2015-05-16 NOTE — Patient Instructions (Signed)

## 2015-05-16 NOTE — Telephone Encounter (Signed)
Error

## 2015-07-04 ENCOUNTER — Encounter: Payer: Self-pay | Admitting: Internal Medicine

## 2015-07-04 ENCOUNTER — Ambulatory Visit (INDEPENDENT_AMBULATORY_CARE_PROVIDER_SITE_OTHER): Payer: BLUE CROSS/BLUE SHIELD | Admitting: Internal Medicine

## 2015-07-04 VITALS — BP 148/100 | HR 88 | Ht 70.75 in | Wt 342.8 lb

## 2015-07-04 DIAGNOSIS — R194 Change in bowel habit: Secondary | ICD-10-CM | POA: Diagnosis not present

## 2015-07-04 DIAGNOSIS — R197 Diarrhea, unspecified: Secondary | ICD-10-CM

## 2015-07-04 DIAGNOSIS — Z800 Family history of malignant neoplasm of digestive organs: Secondary | ICD-10-CM

## 2015-07-04 DIAGNOSIS — R1084 Generalized abdominal pain: Secondary | ICD-10-CM | POA: Diagnosis not present

## 2015-07-04 NOTE — Progress Notes (Signed)
HISTORY OF PRESENT ILLNESS:  Jeremy Leonard is a 24 y.o. male who presents today for follow-up regarding change in bowel habits and abdominal pain. Initially evaluated March 2016 for an eight-month history of chronic diarrhea. Seemed to respond to metronidazole. Retreated for recurrent symptoms. Subsequently seen for constipation and soft stools. Last evaluated 05/16/2015. Reported father with colon cancer at age 9. Prescribed Bentyl for abdominal discomfort. Schedule for colonoscopy 07/16/2015. Does report nonspecific improvement in his bowels since his last visit, though not normal. He is still concern. He is on several supplements as well as special diet. He is happy to be losing weight. Exercising  REVIEW OF SYSTEMS:  All non-GI ROS negative except for satiety, back pain, fatigue, muscle cramps, night sweats, increased thirst, increased urination, shortness of breath, sore throat  Past Medical History  Diagnosis Date  . Hypertension   . IBS (irritable bowel syndrome)   . Morbid obesity     Past Surgical History  Procedure Laterality Date  . Knee surgery Right   . Tonsillectomy      Social History Shankar Silber  reports that he has never smoked. He does not have any smokeless tobacco history on file. He reports that he does not drink alcohol or use illicit drugs.  family history includes Cancer (age of onset: 30) in his father; Colon cancer (age of onset: 35) in his father; Diabetes in his mother; Hypertension in an other family member.  No Known Allergies     PHYSICAL EXAMINATION: Vital signs: BP 148/100 mmHg  Pulse 88  Ht 5' 10.75" (1.797 m)  Wt 342 lb 12.8 oz (155.493 kg)  BMI 48.15 kg/m2 General: Well-developed, well-nourished, no acute distress HEENT: Sclerae are anicteric, conjunctiva pink. Oral mucosa intact Lungs: Clear Heart: Regular Abdomen: soft, obese, nontender, nondistended, no obvious ascites, no peritoneal signs, normal bowel sounds. No  organomegaly. Extremities: No clubbing cyanosis or edema Psychiatric: alert and oriented x3. Cooperative   ASSESSMENT:  #1. Change in bowel habits as described. Ongoing #2. Family history of colon cancer #3. Morbid obesity  PLAN:  #1. Plans for colonoscopy to evaluate bowel habit change, transient abdominal discomfort, and provide screening given his history.The nature of the procedure, as well as the risks, benefits, and alternatives were carefully and thoroughly reviewed with the patient. Ample time for discussion and questions allowed. The patient understood, was satisfied, and agreed to proceed.

## 2015-07-04 NOTE — Patient Instructions (Signed)
Your colonoscopy is scheduled for 9/12/206 at 3:30pm

## 2015-07-16 ENCOUNTER — Encounter: Payer: Self-pay | Admitting: Internal Medicine

## 2015-07-16 ENCOUNTER — Ambulatory Visit (AMBULATORY_SURGERY_CENTER): Payer: BLUE CROSS/BLUE SHIELD | Admitting: Internal Medicine

## 2015-07-16 VITALS — BP 140/114 | HR 71 | Temp 97.8°F | Resp 19 | Ht 70.0 in | Wt 349.0 lb

## 2015-07-16 DIAGNOSIS — D120 Benign neoplasm of cecum: Secondary | ICD-10-CM

## 2015-07-16 DIAGNOSIS — Z1211 Encounter for screening for malignant neoplasm of colon: Secondary | ICD-10-CM

## 2015-07-16 DIAGNOSIS — R194 Change in bowel habit: Secondary | ICD-10-CM

## 2015-07-16 DIAGNOSIS — Z800 Family history of malignant neoplasm of digestive organs: Secondary | ICD-10-CM | POA: Diagnosis not present

## 2015-07-16 DIAGNOSIS — K635 Polyp of colon: Secondary | ICD-10-CM

## 2015-07-16 MED ORDER — SODIUM CHLORIDE 0.9 % IV SOLN: 500.0000 mL | INTRAVENOUS | Status: DC

## 2015-07-16 NOTE — Progress Notes (Signed)
Called to room to assist during endoscopic procedure.  Patient ID and intended procedure confirmed with present staff. Received instructions for my participation in the procedure from the performing physician.  

## 2015-07-16 NOTE — Op Note (Signed)
[place]  [address]. [place], [id number]   COLONOSCOPY PROCEDURE REPORT  PATIENT: Jeremy Leonard, Jeremy Leonard  MR#: [id number] BIRTHDATE: 1991/03/08 , 24  yrs. old GENDER: male ENDOSCOPIST: Eustace Quail, MD REFERRED BY:.  Self / Office PROCEDURE DATE:  07/16/2015 PROCEDURE:   Colonoscopy, screening and Colonoscopy with snare polypectomy x 1 First Screening Colonoscopy - Avg.  risk and is 50 yrs.  old or older Yes.  Prior Negative Screening - Now for repeat screening. N/A  History of Adenoma - Now for follow-up colonoscopy & has been > or = to 3 yrs.  N/A  Polyps removed today? Yes ASA CLASS:   Class II INDICATIONS:Screening for colonic neoplasia and FH Colon or Rectal Adenocarcinoma. Father at 61. MEDICATIONS: Monitored anesthesia care and Propofol 450 mg IV  DESCRIPTION OF PROCEDURE:   After the risks benefits and alternatives of the procedure were thoroughly explained, informed consent was obtained.  The digital rectal exam revealed no abnormalities of the rectum.   The LB [id number]  endoscope was introduced through the anus and advanced to the cecum, which was identified by both the appendix and ileocecal valve. No adverse events experienced.   The quality of the prep was excellent. (Suprep was used)  The instrument was then slowly withdrawn as the colon was fully examined. Estimated blood loss is zero unless otherwise noted in this procedure report.   COLON FINDINGS: A single polyp measuring 2 mm in size was found at the cecum.  A polypectomy was performed with a cold snare.  The resection was complete, the polyp tissue was completely retrieved and sent to histology.   The examined terminal ileum appeared to be normal.   The examination was otherwise normal.  Retroflexed views revealed internal hemorrhoids. The time to cecum = 1.8 Withdrawal time = 9.0   The scope was withdrawn and the procedure completed. COMPLICATIONS: There were no immediate  complications.  ENDOSCOPIC IMPRESSION: 1.   Single polyp was found at the cecum; polypectomy was performed with a cold snare 2.   The examined terminal ileum appeared to be normal 3.   The examination was otherwise normal 4.   Change in bowel habits. Nonspecific 5.   Hypertension  RECOMMENDATIONS: 1. Repeat colonoscopy in 5 years if polyp adenomatous; otherwise age 50 2. Please arrange appointment with primary care as new patient for management of hypertension  eSigned:  Eustace Quail, MD 07/16/2015 3:50 PM   cc: The Patient

## 2015-07-16 NOTE — Progress Notes (Signed)
To recovery, report to [place], RN, VSS 

## 2015-07-16 NOTE — Patient Instructions (Signed)
Impressions/recommendations:  Polyp (handout given) Repeat colonoscopy pending pathology.  YOU HAD AN ENDOSCOPIC PROCEDURE TODAY AT THE  ENDOSCOPY CENTER:   Refer to the procedure report that was given to you for any specific questions about what was found during the examination.  If the procedure report does not answer your questions, please call your gastroenterologist to clarify.  If you requested that your care partner not be given the details of your procedure findings, then the procedure report has been included in a sealed envelope for you to review at your convenience later.  YOU SHOULD EXPECT: Some feelings of bloating in the abdomen. Passage of more gas than usual.  Walking can help get rid of the air that was put into your GI tract during the procedure and reduce the bloating. If you had a lower endoscopy (such as a colonoscopy or flexible sigmoidoscopy) you may notice spotting of blood in your stool or on the toilet paper. If you underwent a bowel prep for your procedure, you may not have a normal bowel movement for a few days.  Please Note:  You might notice some irritation and congestion in your nose or some drainage.  This is from the oxygen used during your procedure.  There is no need for concern and it should clear up in a day or so.  SYMPTOMS TO REPORT IMMEDIATELY:   Following lower endoscopy (colonoscopy or flexible sigmoidoscopy):  Excessive amounts of blood in the stool  Significant tenderness or worsening of abdominal pains  Swelling of the abdomen that is new, acute  Fever of 100F or higher   For urgent or emergent issues, a gastroenterologist can be reached at any hour by calling [phone].   DIET: Your first meal following the procedure should be a small meal and then it is ok to progress to your normal diet. Heavy or fried foods are harder to digest and may make you feel nauseous or bloated.  Likewise, meals heavy in dairy and vegetables can increase  bloating.  Drink plenty of fluids but you should avoid alcoholic beverages for 24 hours.  ACTIVITY:  You should plan to take it easy for the rest of today and you should NOT DRIVE or use heavy machinery until tomorrow (because of the sedation medicines used during the test).    FOLLOW UP: Our staff will call the number listed on your records the next business day following your procedure to check on you and address any questions or concerns that you may have regarding the information given to you following your procedure. If we do not reach you, we will leave a message.  However, if you are feeling well and you are not experiencing any problems, there is no need to return our call.  We will assume that you have returned to your regular daily activities without incident.  If any biopsies were taken you will be contacted by phone or by letter within the next 1-3 weeks.  Please call us at [phone] if you have not heard about the biopsies in 3 weeks.    SIGNATURES/CONFIDENTIALITY: You and/or your care partner have signed paperwork which will be entered into your electronic medical record.  These signatures attest to the fact that that the information above on your After Visit Summary has been reviewed and is understood.  Full responsibility of the confidentiality of this discharge information lies with you and/or your care-partner. 

## 2015-07-17 ENCOUNTER — Telehealth: Payer: Self-pay | Admitting: *Deleted

## 2015-07-17 NOTE — Telephone Encounter (Signed)
  Follow up Call-  Call back number 07/16/2015  Post procedure Call Back phone  # [phone]  Permission to leave phone message Yes    Voicemail not set up, no message left

## 2015-07-24 ENCOUNTER — Encounter: Payer: Self-pay | Admitting: Internal Medicine

## 2015-07-27 ENCOUNTER — Ambulatory Visit: Payer: BLUE CROSS/BLUE SHIELD | Admitting: Family

## 2015-08-03 ENCOUNTER — Ambulatory Visit: Payer: BLUE CROSS/BLUE SHIELD | Admitting: Family

## 2016-08-25 IMAGING — CT CT HEAD W/O CM
2 series · 16 of 30 positions shown, 20 images · non-contrast
Comparison: None.

CLINICAL DATA: Patient with headache and blurred vision.

EXAM:
CT HEAD WITHOUT CONTRAST
TECHNIQUE: Contiguous axial images were obtained from the base of the skull
through the vertex without intravenous contrast.

[Series 201: head w/o, idose (1) · axial · non-contrast · 0.52mm/px · z∈[+76,+196]mm · 13 of 30 slices shown, 17 images]
[im 3/30  brain]
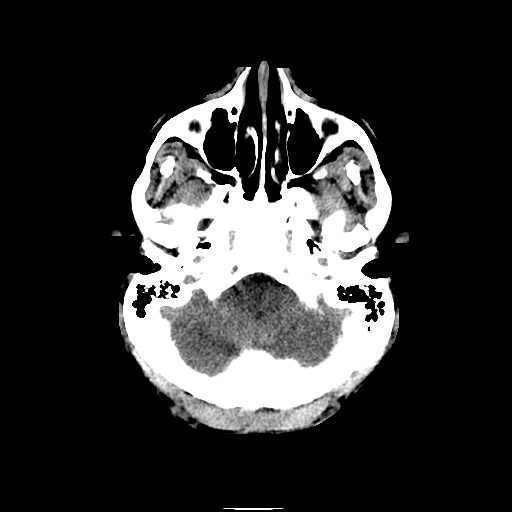
[im 3/30  bone]
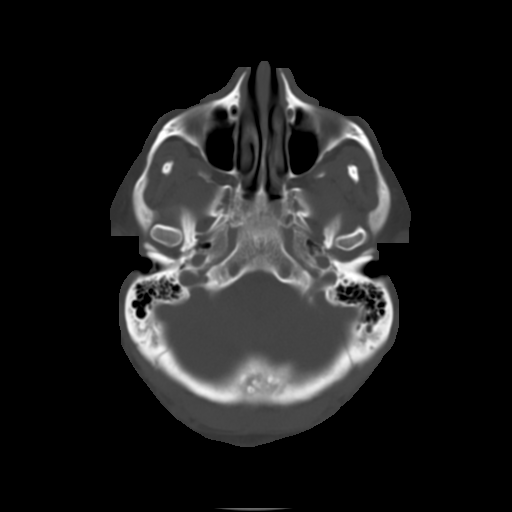
[im 5/30  brain]
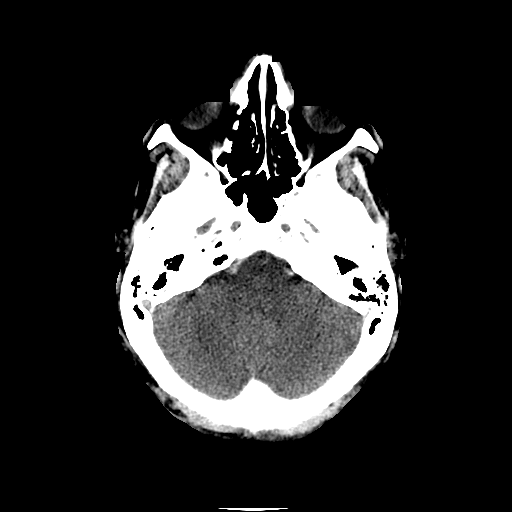
[im 7/30  brain]
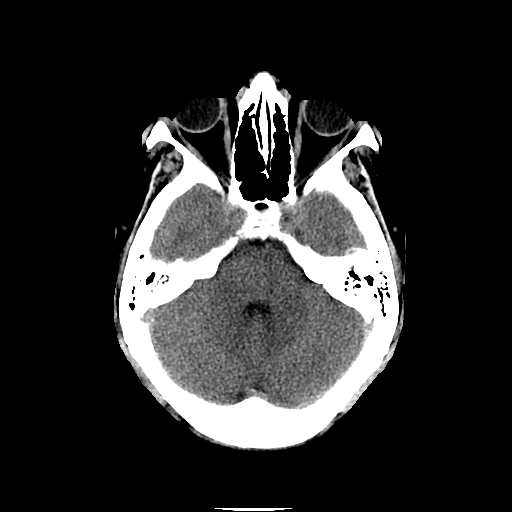
[im 9/30  brain]
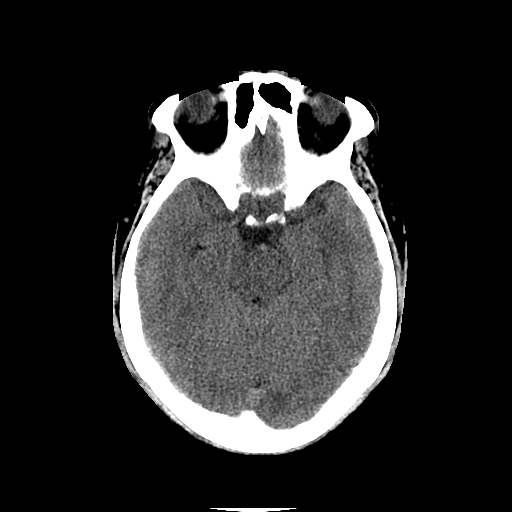
[im 11/30  brain]
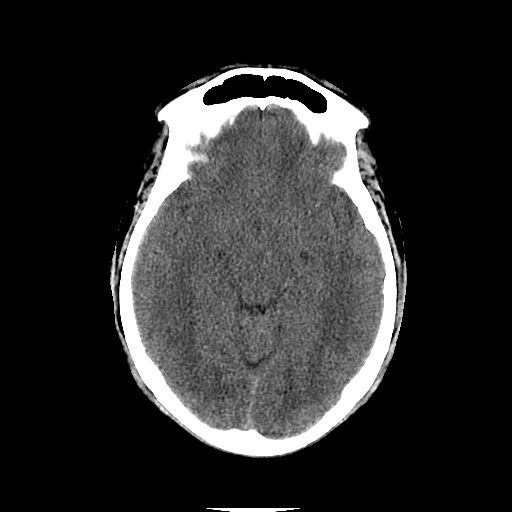
[im 11/30  bone]
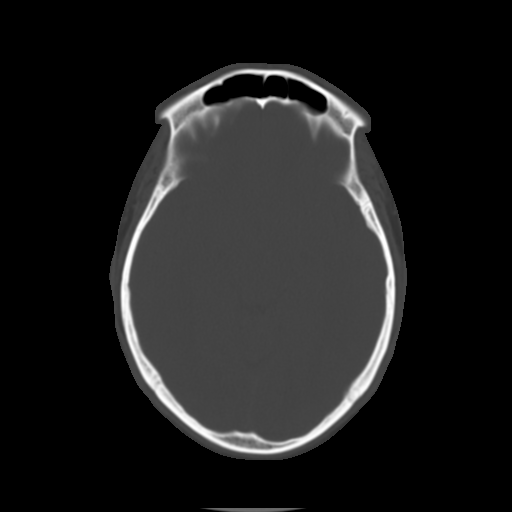
[im 13/30  brain]
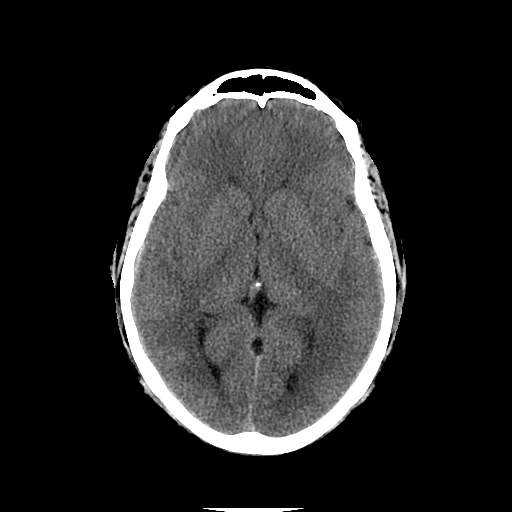
[im 15/30  brain]
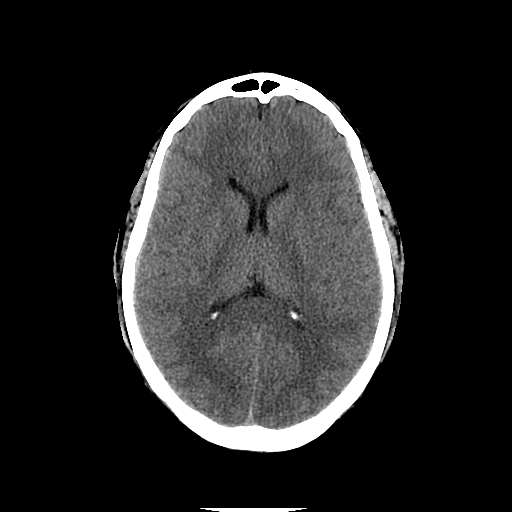
[im 17/30  brain]
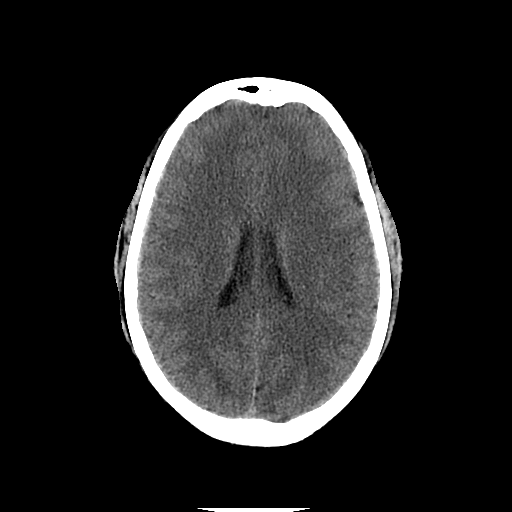
[im 19/30  brain]
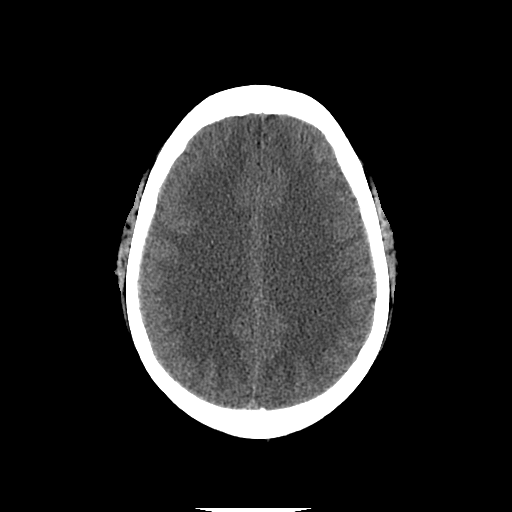
[im 19/30  bone]
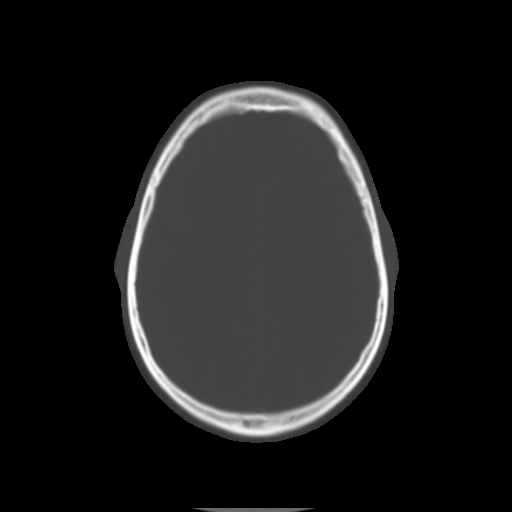
[im 21/30  brain]
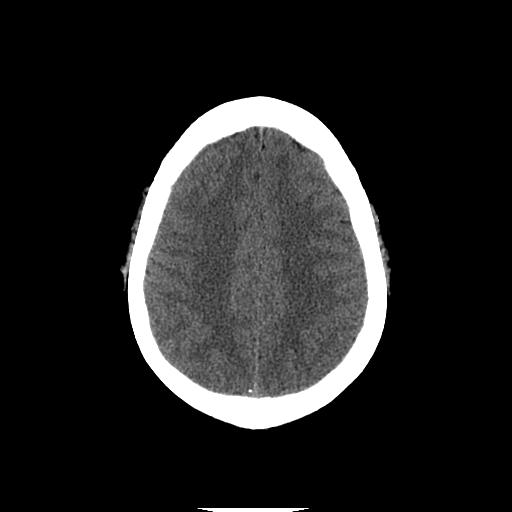
[im 23/30  brain]
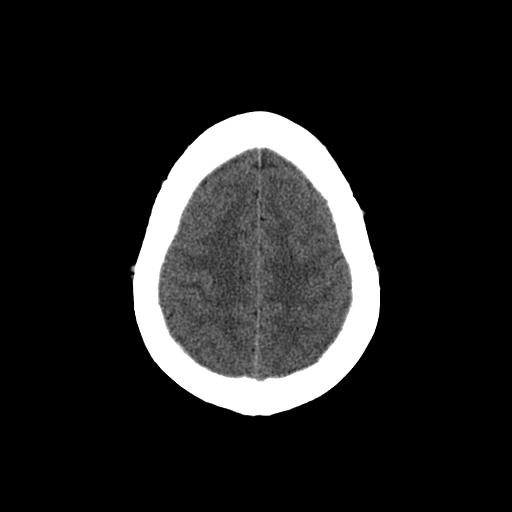
[im 25/30  brain]
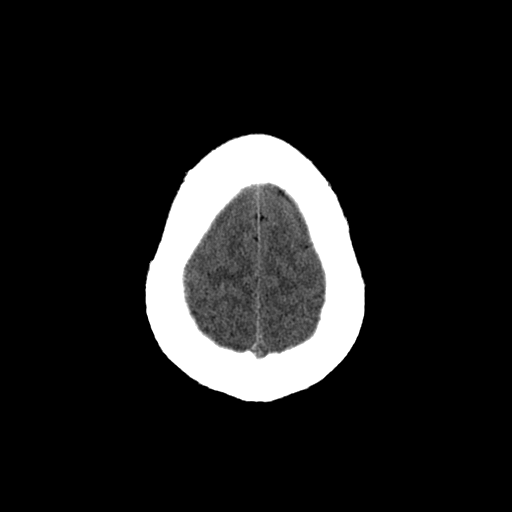
[im 27/30  brain]
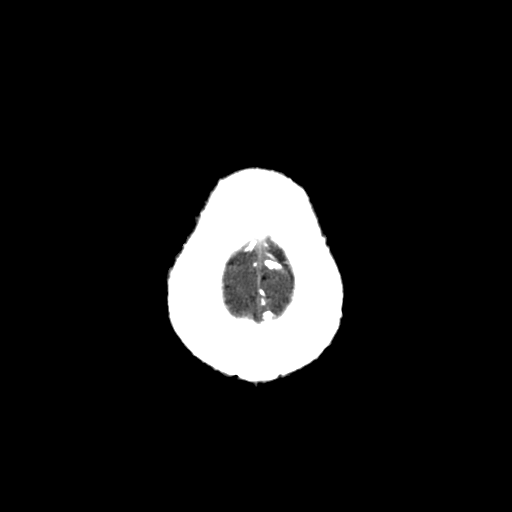
[im 27/30  bone]
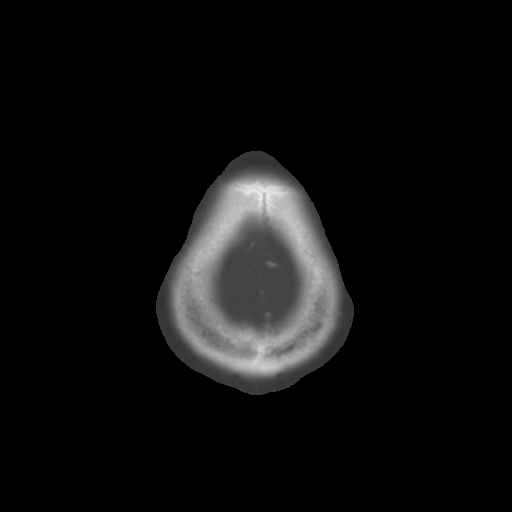

[Series 202: head w/o bone, idose (1) · axial · non-contrast · 0.52mm/px · z∈[+76,+116]mm · 3 of 30 slices shown]
[im 3/30  bone]
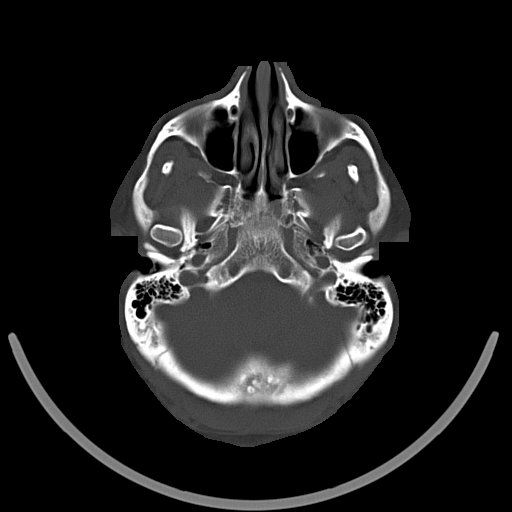
[im 7/30  bone]
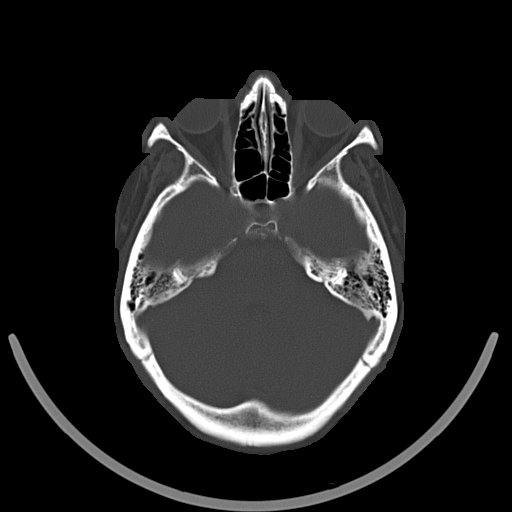
[im 11/30  bone]
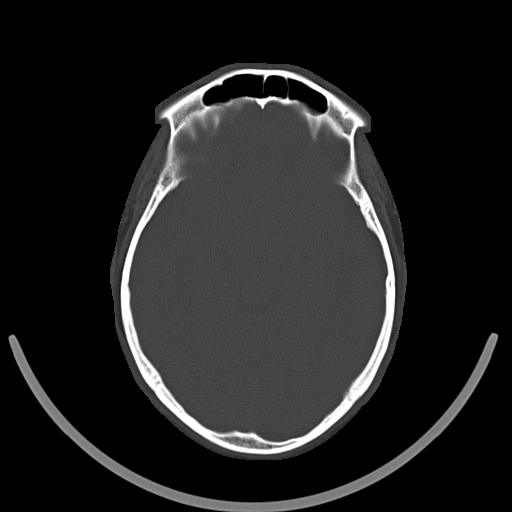

[16 of 30 positions shown; findings below may reference images not displayed]

FINDINGS: Ventricles and sulci are appropriate for patient's age. No evidence
for acute cortically based infarct, intracranial hemorrhage, mass
lesion or mass effect. The orbits are unremarkable. Paranasal
sinuses are unremarkable. Mastoid air cells are well aerated.
Calvarium is intact.
IMPRESSION: No acute intracranial process.
